# Patient Record
Sex: Male | Born: 1990 | Race: Black or African American | Hispanic: No | Smoking: Current every day smoker
Health system: Southern US, Community
[De-identification: ages and names within clinical notes are randomized; demographics above are authoritative.]

## PROBLEM LIST (undated history)

## (undated) DIAGNOSIS — Q780 Osteogenesis imperfecta: Secondary | ICD-10-CM

## (undated) DIAGNOSIS — F4310 Post-traumatic stress disorder, unspecified: Secondary | ICD-10-CM

## (undated) HISTORY — PX: STOMACH SURGERY: SHX791

## (undated) HISTORY — PX: HAND ARTHROPLASTY: SHX968

## (undated) HISTORY — PX: HIP ARTHROSCOPY: SUR88

## (undated) HISTORY — PX: MANDIBLE RECONSTRUCTION: SHX431

## (undated) HISTORY — PX: ELBOW ARTHROPLASTY: SHX928

---
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
## 2012-05-20 IMAGING — CR DG FOREARM 2V*L*
1 series · 3 of 3 positions shown · non-contrast
Comparison: none

REASON FOR EXAM: open deformity sp fall
COMMENTS:

[Series 1: view not recorded · 0.17mm/px · 3 of 3 slices shown]
[im 1/3]
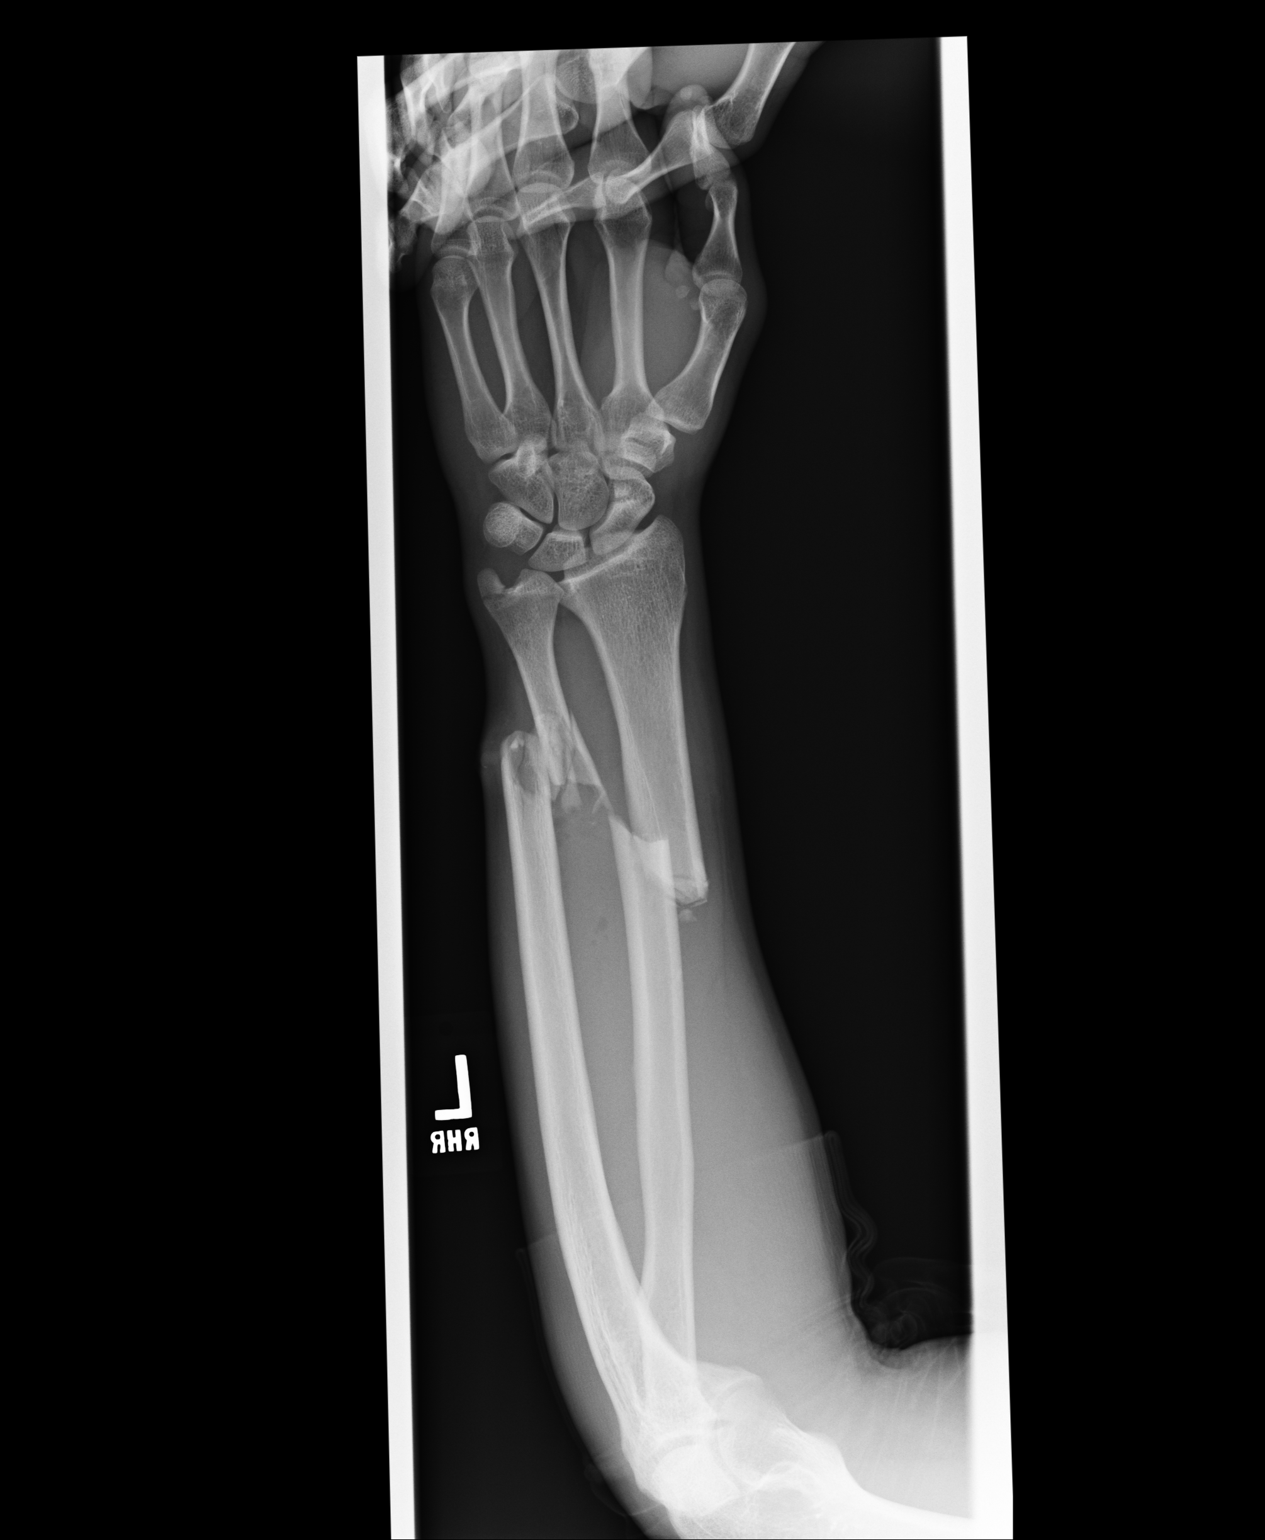
[im 2/3]
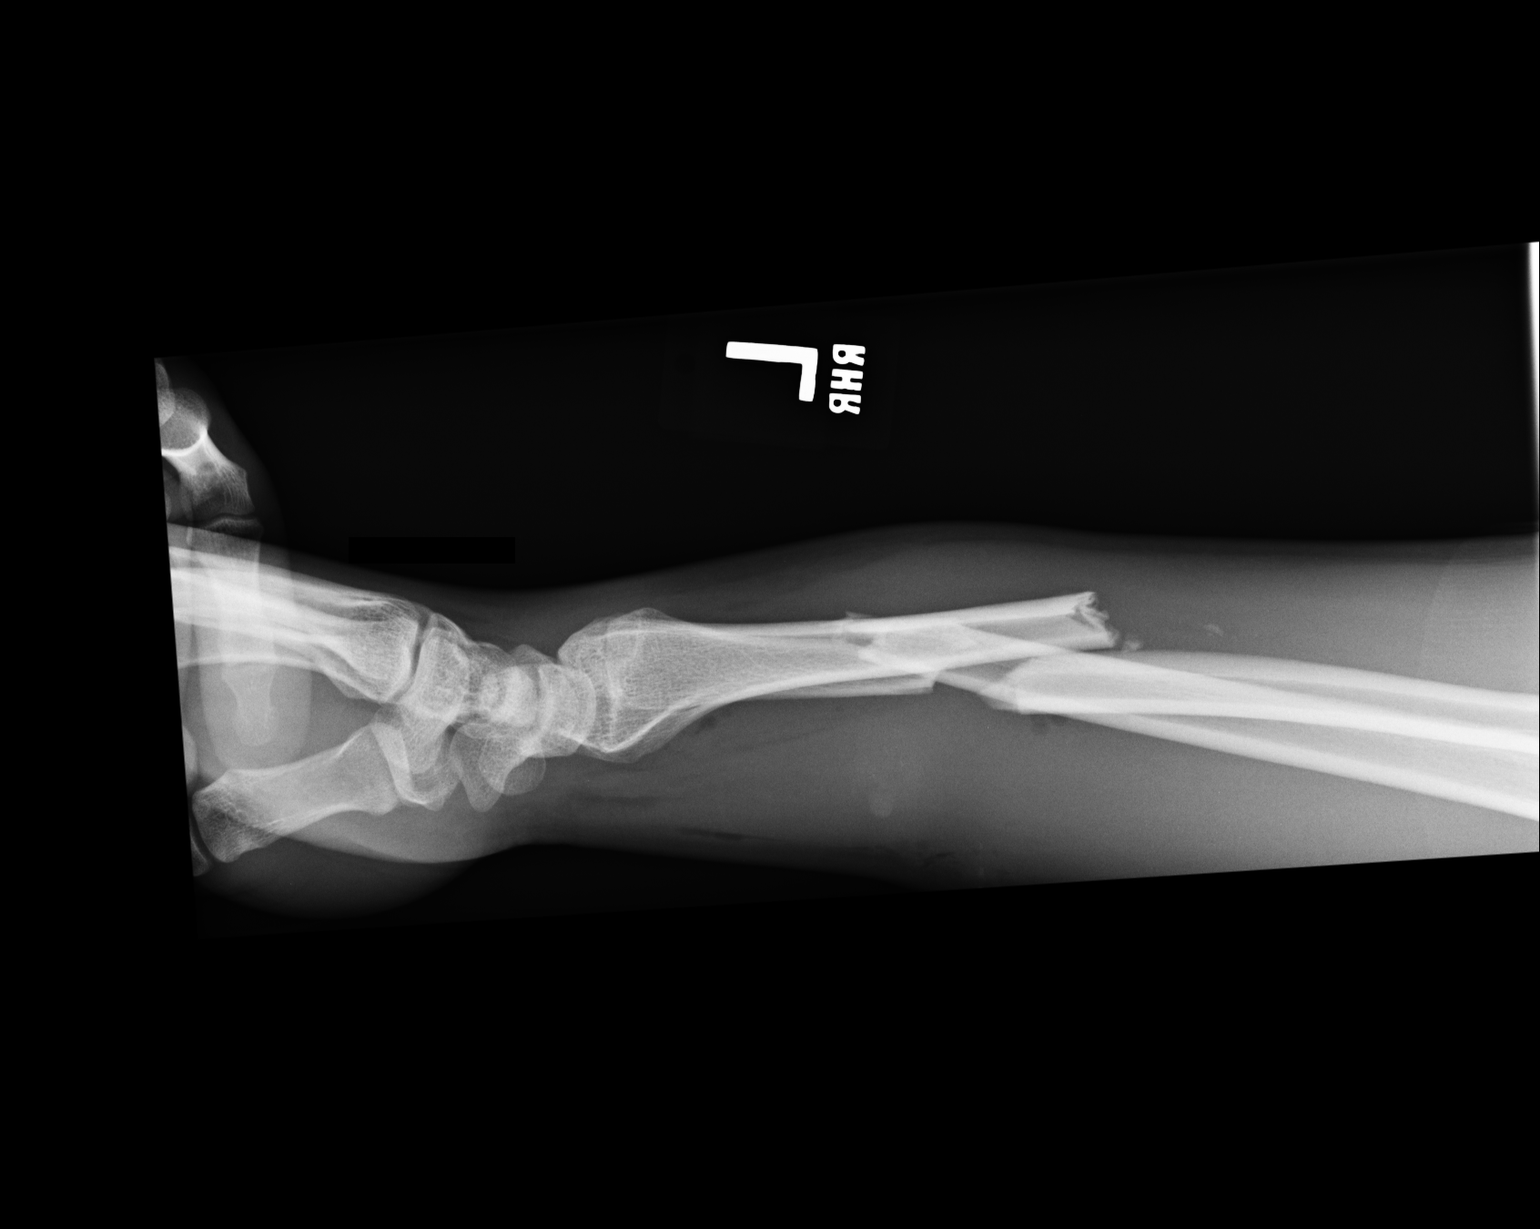
[im 3/3]
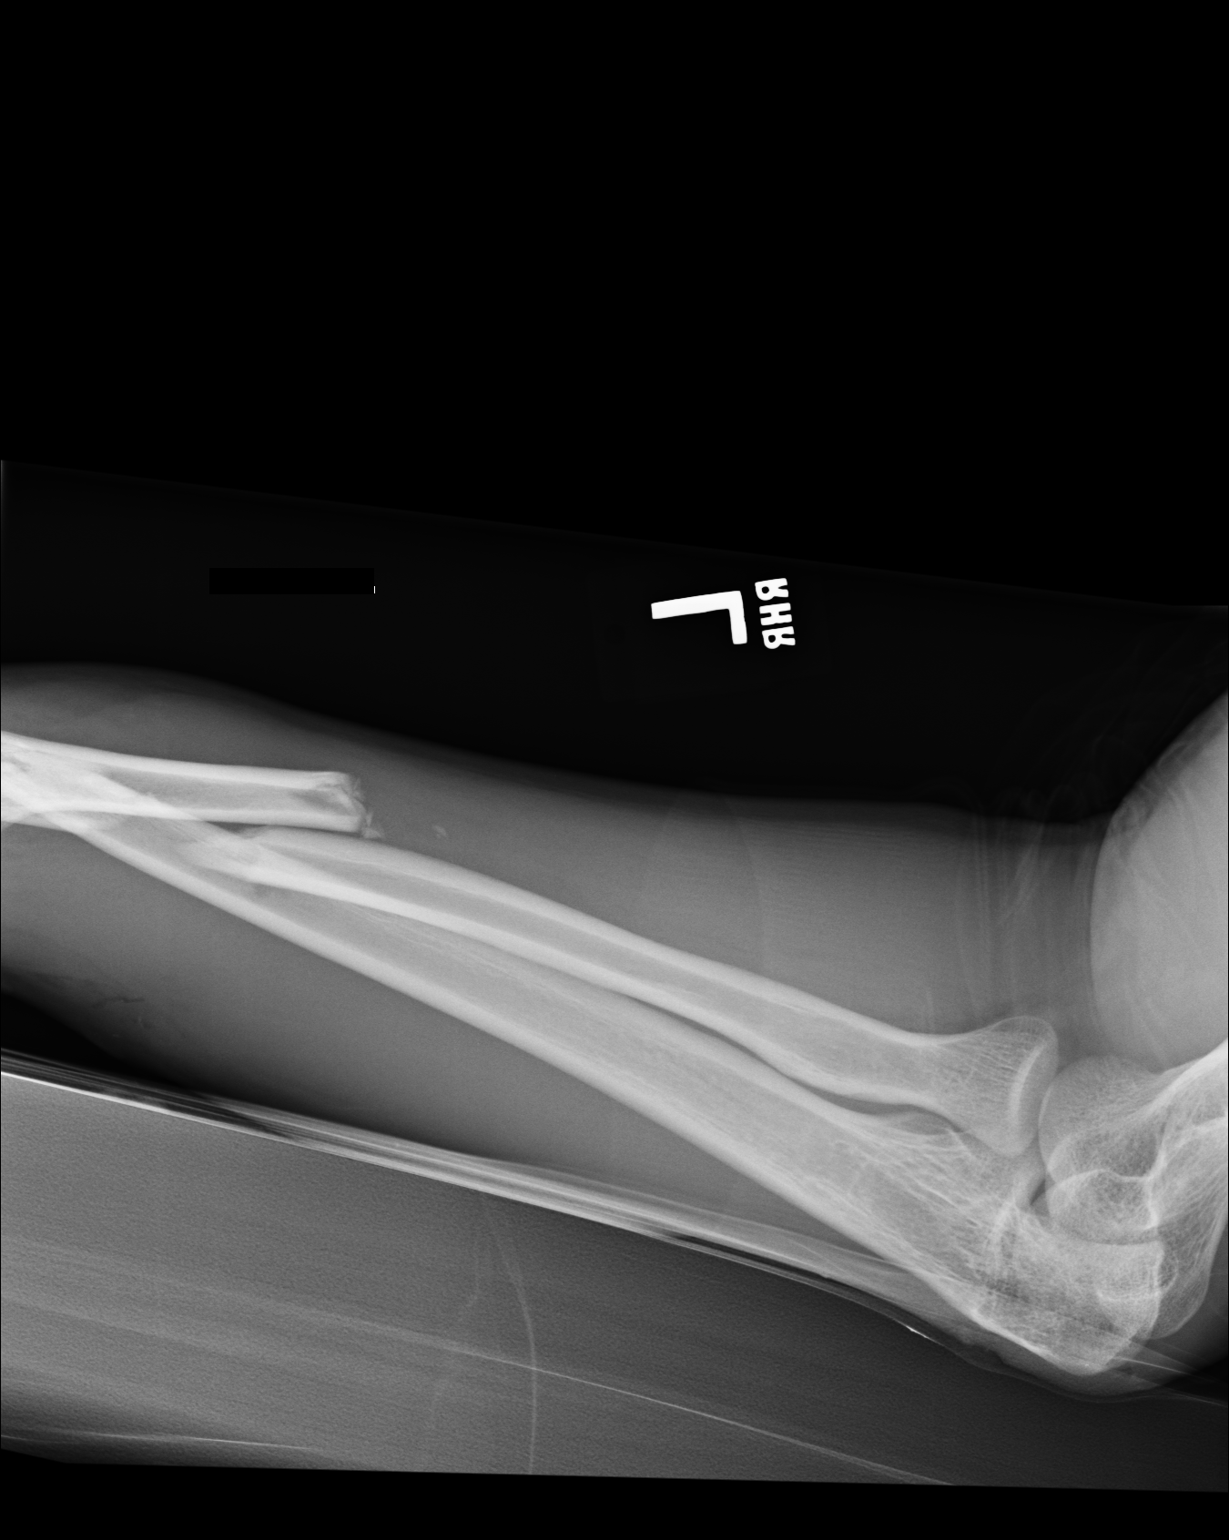

[3 of 3 positions shown; findings below may reference images not displayed]

PROCEDURE:     DXR - DXR FOREARM LEFT  - August 05, 2010  [DATE]

RESULT:     There is a mildly comminuted fracture of the radial diaphysis
near the junction of the proximal two thirds and distal third. The distal
fracture component is displaced dorsally and laterally. The distal fracture
component overrides the proximal dorsally by approximately 1.2 cm.

There is a comminuted fracture of the distal ulna diaphysis with the major
distal fracture component displaced laterally by approximately 1.2 cm. The
proximal fracture fragment overrides the distal by approximately 1.2 cm.
IMPRESSION: 1. There are comminuted displaced fractures of the left radial and ulnar
diaphyses as noted above.
2. Also observed is a nondisplaced fracture of the ulna styloid.

## 2013-03-25 ENCOUNTER — Emergency Department: Payer: Self-pay | Admitting: Emergency Medicine

## 2013-06-25 ENCOUNTER — Emergency Department: Payer: Self-pay | Admitting: Emergency Medicine

## 2013-06-25 LAB — HEPATIC FUNCTION PANEL A (ARMC)
Albumin: 4.5 g/dL (ref 3.4–5.0)
Alkaline Phosphatase: 149 U/L — ABNORMAL HIGH (ref 50–136)
Bilirubin, Direct: <0.1 mg/dL (ref 0.00–0.20)
Bilirubin,Total: 0.2 mg/dL (ref 0.2–1.0)
SGOT(AST): 37 U/L (ref 15–37)
SGPT (ALT): 35 U/L (ref 12–78)
Total Protein: 8.8 g/dL — ABNORMAL HIGH (ref 6.4–8.2)

## 2013-06-25 LAB — CBC
HCT: 49.6 % (ref 40.0–52.0)
HGB: 17.1 g/dL (ref 13.0–18.0)
MCH: 30.1 pg (ref 26.0–34.0)
MCHC: 34.4 g/dL (ref 32.0–36.0)
MCV: 87 fL (ref 80–100)
Platelet: 288 10*3/uL (ref 150–440)
RBC: 5.67 10*6/uL (ref 4.40–5.90)
RDW: 13.4 % (ref 11.5–14.5)
WBC: 9.8 10*3/uL (ref 3.8–10.6)

## 2013-06-25 LAB — SEDIMENTATION RATE: Erythrocyte Sed Rate: 1 mm/hr (ref 0–15)

## 2013-06-25 LAB — BASIC METABOLIC PANEL
Anion Gap: 4 — ABNORMAL LOW (ref 7–16)
BUN: 9 mg/dL (ref 7–18)
Calcium, Total: 9.4 mg/dL (ref 8.5–10.1)
Chloride: 105 mmol/L (ref 98–107)
Co2: 27 mmol/L (ref 21–32)
Creatinine: 0.97 mg/dL (ref 0.60–1.30)
EGFR (African American): >60
EGFR (Non-African Amer.): >60
Glucose: 83 mg/dL (ref 65–99)
Osmolality: 270 (ref 275–301)
Potassium: 4 mmol/L (ref 3.5–5.1)
Sodium: 136 mmol/L (ref 136–145)

## 2013-07-09 ENCOUNTER — Ambulatory Visit: Payer: Self-pay | Admitting: Unknown Physician Specialty

## 2013-07-13 LAB — PATHOLOGY REPORT

## 2014-10-29 ENCOUNTER — Emergency Department: Payer: Self-pay | Admitting: Emergency Medicine

## 2015-08-12 ENCOUNTER — Encounter: Payer: Self-pay | Admitting: Emergency Medicine

## 2015-08-12 ENCOUNTER — Emergency Department
Admission: EM | Admit: 2015-08-12 | Discharge: 2015-08-12 | Disposition: A | Discharge status: Home / Self Care | Payer: Self-pay | Attending: Emergency Medicine | Admitting: Emergency Medicine

## 2015-08-12 DIAGNOSIS — F1210 Cannabis abuse, uncomplicated: Secondary | ICD-10-CM | POA: Insufficient documentation

## 2015-08-12 DIAGNOSIS — F17200 Nicotine dependence, unspecified, uncomplicated: Secondary | ICD-10-CM | POA: Insufficient documentation

## 2015-08-12 DIAGNOSIS — F99 Mental disorder, not otherwise specified: Secondary | ICD-10-CM | POA: Insufficient documentation

## 2015-08-12 DIAGNOSIS — F329 Major depressive disorder, single episode, unspecified: Secondary | ICD-10-CM | POA: Insufficient documentation

## 2015-08-12 DIAGNOSIS — F32A Depression, unspecified: Secondary | ICD-10-CM

## 2015-08-12 HISTORY — DX: Osteogenesis imperfecta: Q78.0

## 2015-08-12 LAB — COMPREHENSIVE METABOLIC PANEL
ALT: 47 U/L (ref 17–63)
AST: 42 U/L — ABNORMAL HIGH (ref 15–41)
Albumin: 5.1 g/dL — ABNORMAL HIGH (ref 3.5–5.0)
Alkaline Phosphatase: 115 U/L (ref 38–126)
Anion gap: 9 (ref 5–15)
BUN: 13 mg/dL (ref 6–20)
CO2: 27 mmol/L (ref 22–32)
Calcium: 9.6 mg/dL (ref 8.9–10.3)
Chloride: 101 mmol/L (ref 101–111)
Creatinine, Ser: 0.97 mg/dL (ref 0.61–1.24)
GFR calc Af Amer: >60 mL/min (ref >60)
GFR calc non Af Amer: >60 mL/min (ref >60)
Glucose, Bld: 74 mg/dL (ref 65–99)
Potassium: 3.4 mmol/L — ABNORMAL LOW (ref 3.5–5.1)
Sodium: 137 mmol/L (ref 135–145)
Total Bilirubin: 1 mg/dL (ref 0.3–1.2)
Total Protein: 9.1 g/dL — ABNORMAL HIGH (ref 6.5–8.1)

## 2015-08-12 LAB — URINE DRUG SCREEN, QUALITATIVE (ARMC ONLY)
Amphetamines, Ur Screen: NOT DETECTED
Barbiturates, Ur Screen: NOT DETECTED
Benzodiazepine, Ur Scrn: NOT DETECTED
Cannabinoid 50 Ng, Ur ~~LOC~~: POSITIVE — AB
Cocaine Metabolite,Ur ~~LOC~~: NOT DETECTED
MDMA (Ecstasy)Ur Screen: NOT DETECTED
Methadone Scn, Ur: NOT DETECTED
Opiate, Ur Screen: NOT DETECTED
Phencyclidine (PCP) Ur S: NOT DETECTED
Tricyclic, Ur Screen: NOT DETECTED

## 2015-08-12 LAB — CBC
HCT: 49.2 % (ref 40.0–52.0)
Hemoglobin: 16.8 g/dL (ref 13.0–18.0)
MCH: 29.8 pg (ref 26.0–34.0)
MCHC: 34.2 g/dL (ref 32.0–36.0)
MCV: 87 fL (ref 80.0–100.0)
Platelets: 309 10*3/uL (ref 150–440)
RBC: 5.65 MIL/uL (ref 4.40–5.90)
RDW: 13.4 % (ref 11.5–14.5)
WBC: 13 10*3/uL — ABNORMAL HIGH (ref 3.8–10.6)

## 2015-08-12 LAB — SALICYLATE LEVEL: Salicylate Lvl: <4 mg/dL (ref 2.8–30.0)

## 2015-08-12 LAB — ACETAMINOPHEN LEVEL: Acetaminophen (Tylenol), Serum: <10 ug/mL — ABNORMAL LOW (ref 10–30)

## 2015-08-12 LAB — ETHANOL: Alcohol, Ethyl (B): 41 mg/dL — ABNORMAL HIGH (ref <5)

## 2015-08-12 NOTE — ED Notes (Signed)
Pt dressed out into behavioral clothes, 2 bags of belongings (clothes and car key) searched and given to April, report of pt to same, Sgt McAdoo and Alissa apprised of pt situation

## 2015-08-12 NOTE — ED Notes (Signed)
[address] Sum[address] GreenbrierHaith, [phone], and "grandma", [phone], contacted this RN for pt info, oriented to phone and visitation rules
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
## 2015-11-02 ENCOUNTER — Encounter: Payer: Self-pay | Admitting: Emergency Medicine

## 2015-11-02 ENCOUNTER — Emergency Department
Admission: EM | Admit: 2015-11-02 | Discharge: 2015-11-02 | Disposition: A | Discharge status: Home / Self Care | Payer: Self-pay | Attending: Emergency Medicine | Admitting: Emergency Medicine

## 2015-11-02 DIAGNOSIS — Y990 Civilian activity done for income or pay: Secondary | ICD-10-CM | POA: Insufficient documentation

## 2015-11-02 DIAGNOSIS — Y9289 Other specified places as the place of occurrence of the external cause: Secondary | ICD-10-CM | POA: Insufficient documentation

## 2015-11-02 DIAGNOSIS — S61219A Laceration without foreign body of unspecified finger without damage to nail, initial encounter: Secondary | ICD-10-CM

## 2015-11-02 DIAGNOSIS — S61211A Laceration without foreign body of left index finger without damage to nail, initial encounter: Secondary | ICD-10-CM | POA: Insufficient documentation

## 2015-11-02 DIAGNOSIS — Y288XXA Contact with other sharp object, undetermined intent, initial encounter: Secondary | ICD-10-CM | POA: Insufficient documentation

## 2015-11-02 DIAGNOSIS — F17200 Nicotine dependence, unspecified, uncomplicated: Secondary | ICD-10-CM | POA: Insufficient documentation

## 2015-11-02 DIAGNOSIS — Z23 Encounter for immunization: Secondary | ICD-10-CM | POA: Insufficient documentation

## 2015-11-02 DIAGNOSIS — Y9389 Activity, other specified: Secondary | ICD-10-CM | POA: Insufficient documentation

## 2015-11-02 MED ORDER — TRAMADOL HCL 50 MG PO TABS: 50.0000 mg | ORAL_TABLET | Freq: Four times a day (QID) | ORAL | Status: DC | PRN

## 2015-11-02 MED ORDER — TETANUS-DIPHTH-ACELL PERTUSSIS 5-2.5-18.5 LF-MCG/0.5 IM SUSP
0.5000 mL | Freq: Once | INTRAMUSCULAR | Status: AC
Start: 2015-11-02 — End: 2015-11-02
  Administered 2015-11-02: 0.5 mL via INTRAMUSCULAR
  Filled 2015-11-02: qty 0.5

## 2015-11-02 NOTE — ED Notes (Signed)
Laceration noted to left hand   Avulsion noted to left index finger..cutting some plastic wrap at work

## 2015-11-02 NOTE — Discharge Instructions (Signed)

## 2015-11-02 NOTE — ED Provider Notes (Signed)
[facility] Emergency Department Provider Note  ____________________________________________  Time seen: Approximately 4:51 PM  I have reviewed the triage vital signs and the nursing notes.   HISTORY  Chief Complaint Laceration    HPI Michael Finley is a 25 y.o. male who presents to emergency department complaining of a laceration to his left index finger. Patient states he was using a box cutter at work when it slipped and cut through his finger. Patient states that he wrapped his finger up with direct pressure prior to arrival. Patient denies using blood thinners. He denies any bleeding disorders. She states that the pain is moderate this time.   Past Medical History  Diagnosis Date  . Osteogenesis imperfecta     There are no active problems to display for this patient.   Past Surgical History  Procedure Laterality Date  . Elbow arthroplasty Bilateral     Current Outpatient Rx  Name  Route  Sig  Dispense  Refill  . traMADol (ULTRAM) 50 MG tablet   Oral   Take 1 tablet (50 mg total) by mouth every 6 (six) hours as needed.   5 tablet   0     Allergies Review of patient's allergies indicates no known allergies.  No family history on file.  Social History Social History  Substance Use Topics  . Smoking status: Current Every Day Smoker -- 0.50 packs/day  . Smokeless tobacco: None  . Alcohol Use: Yes     Comment: occasional     Review of Systems  Constitutional: No fever/chills Cardiovascular: no chest pain. Respiratory: no cough. No SOB. Skin: Negative for rash. Positive for laceration to the second digit left hand Neurological: Negative for headaches, focal weakness or numbness. 10-point ROS otherwise negative.  ____________________________________________   PHYSICAL EXAM:  VITAL SIGNS: ED Triage Vitals  Enc Vitals Group     BP 11/02/15 1642 163/105 mmHg     Pulse Rate 11/02/15 1642 111     Resp 11/02/15 1642 18   Temp 11/02/15 1642 98.6 F (37 C)     Temp Source 11/02/15 1642 Oral     SpO2 11/02/15 1642 99 %     Weight 11/02/15 1642 230 lb (104.327 kg)     Height 11/02/15 1642  (1.753 m)     Head Cir --      Peak Flow --      Pain Score 11/02/15 1642 3     Pain Loc --      Pain Edu? --      Excl. in GC? --      Constitutional: Alert and oriented. Well appearing and in no acute distress. Eyes: Conjunctivae are normal. PERRL. EOMI. Head: Atraumatic. Cardiovascular: Normal rate, regular rhythm. Normal S1 and S2.  Good peripheral circulation. Respiratory: Normal respiratory effort without tachypnea or retractions. Lungs CTAB. Musculoskeletal: Full range of motion to all digits left hand. Laceration as described below. Neurologic:  Normal speech and language. No gross focal neurologic deficits are appreciated.  Skin:  Skin is warm, dry and intact. No rash noted. Patient has a avulsion laceration to the lateral aspect of the second digit left hand. There is no skin flap remaining. Bleeding was controlled prior to arrival. Upon removing the bandage area starts to bleed again. This is controlled using direct pressure. No visible foreign body. Patient's capillary refill is present distally. Sensation intact distally. Psychiatric: Mood and affect are normal. Speech and behavior are normal. Patient exhibits appropriate insight and judgement.  ____________________________________________   LABS (all labs ordered are listed, but only abnormal results are displayed)  Labs Reviewed - No data to display ____________________________________________  EKG   ____________________________________________  RADIOLOGY   No results found.  ____________________________________________    PROCEDURES  Procedure(s) performed:    LACERATION REPAIR Performed by: Racheal PatchesJonathan D Tadao Emig Authorized by: Delorise RoyalsJonathan D Lionardo Haze Consent: Verbal consent obtained. Risks and benefits: risks, benefits and  alternatives were discussed Consent given by: patient Patient identity confirmed: provided demographic data Prepped and Draped in normal sterile fashion Wound explored  Laceration Location: Second digit left hand.  Laceration Length: 2 cm  No Foreign Bodies seen or palpated  Irrigation method: syringe Amount of cleaning: standard  Technique: Laceration is avulsion with no skin flap. There is no area to be closed. Wound is covered and Surgicel, adhesive dressing, and managed accordingly. Patient tolerated procedure well with no complications. Bleeding is controlled with Surgicel.   Patient tolerance: Patient tolerated the procedure well with no immediate complications.    Medications - No data to display   ____________________________________________   INITIAL IMPRESSION / ASSESSMENT AND PLAN / ED COURSE  Pertinent labs & imaging results that were available during my care of the patient were reviewed by me and considered in my medical decision making (see chart for details).  Patient's diagnosis is consistent with a laceration to the second digit left hand. Area is treated as described above.  Patient is given ED precautions to return to the ED for any worsening or new symptoms.     ____________________________________________  FINAL CLINICAL IMPRESSION(S) / ED DIAGNOSES  Final diagnoses:  Finger laceration, initial encounter      NEW MEDICATIONS STARTED DURING THIS VISIT:  New Prescriptions   TRAMADOL (ULTRAM) 50 MG TABLET    Take 1 tablet (50 mg total) by mouth every 6 (six) hours as needed.        This chart was dictated using voice recognition software/Dragon. Despite best efforts to proofread, errors can occur which can change the meaning. Any change was purely unintentional.    Racheal PatchesJonathan D Kailyn Vanderslice, PA-C 11/02/15 1708  Arnaldo NatalPaul F Malinda, MD 11/03/15 [phone]

## 2015-12-04 ENCOUNTER — Encounter: Payer: Self-pay | Admitting: Emergency Medicine

## 2015-12-04 ENCOUNTER — Emergency Department
Admission: EM | Admit: 2015-12-04 | Discharge: 2015-12-04 | Disposition: A | Discharge status: Home / Self Care | Payer: Self-pay | Attending: Emergency Medicine | Admitting: Emergency Medicine

## 2015-12-04 DIAGNOSIS — Y998 Other external cause status: Secondary | ICD-10-CM | POA: Insufficient documentation

## 2015-12-04 DIAGNOSIS — M5442 Lumbago with sciatica, left side: Secondary | ICD-10-CM | POA: Insufficient documentation

## 2015-12-04 DIAGNOSIS — F17200 Nicotine dependence, unspecified, uncomplicated: Secondary | ICD-10-CM | POA: Insufficient documentation

## 2015-12-04 DIAGNOSIS — M5432 Sciatica, left side: Secondary | ICD-10-CM

## 2015-12-04 DIAGNOSIS — Y9389 Activity, other specified: Secondary | ICD-10-CM | POA: Insufficient documentation

## 2015-12-04 DIAGNOSIS — S39012A Strain of muscle, fascia and tendon of lower back, initial encounter: Secondary | ICD-10-CM | POA: Insufficient documentation

## 2015-12-04 DIAGNOSIS — Y92007 Garden or yard of unspecified non-institutional (private) residence as the place of occurrence of the external cause: Secondary | ICD-10-CM | POA: Insufficient documentation

## 2015-12-04 DIAGNOSIS — X500XXA Overexertion from strenuous movement or load, initial encounter: Secondary | ICD-10-CM | POA: Insufficient documentation

## 2015-12-04 MED ORDER — OXYCODONE-ACETAMINOPHEN 5-325 MG PO TABS: 1.0000 | ORAL_TABLET | ORAL | Status: DC | PRN

## 2015-12-04 MED ORDER — OXYCODONE-ACETAMINOPHEN 5-325 MG PO TABS
1.0000 | ORAL_TABLET | Freq: Once | ORAL | Status: AC
  Administered 2015-12-04: 1 via ORAL
  Filled 2015-12-04: qty 1

## 2015-12-04 MED ORDER — KETOROLAC TROMETHAMINE 30 MG/ML IJ SOLN
30.0000 mg | Freq: Once | INTRAMUSCULAR | Status: AC
  Administered 2015-12-04: 30 mg via INTRAMUSCULAR
  Filled 2015-12-04: qty 1

## 2015-12-04 MED ORDER — CYCLOBENZAPRINE HCL 10 MG PO TABS
5.0000 mg | ORAL_TABLET | Freq: Once | ORAL | Status: AC
  Administered 2015-12-04: 5 mg via ORAL
  Filled 2015-12-04: qty 2

## 2015-12-04 MED ORDER — CYCLOBENZAPRINE HCL 5 MG PO TABS: 5.0000 mg | ORAL_TABLET | Freq: Three times a day (TID) | ORAL | Status: DC | PRN

## 2015-12-04 MED ORDER — IBUPROFEN 800 MG PO TABS: 800.0000 mg | ORAL_TABLET | Freq: Three times a day (TID) | ORAL | Status: DC | PRN

## 2015-12-04 NOTE — ED Notes (Signed)
Pt. States while working in his yard, picked up a large tree branch and heard something "pop" in his back.  Pt. States unable to sleep tonight.  Pt. States pain is in the lower left side of back.

## 2015-12-04 NOTE — Discharge Instructions (Signed)
1. Knee may take medicines as needed for pain and muscle spasms (Motrin/Percocet/Flexeril #15). 2. Apply moist heat to affected area several times daily. 3. Return to the ER for worsening symptoms, persistent vomiting, difficulty breathing or other concerns.  Low Back Sprain With Rehab A sprain is an injury in which a ligament is torn. The ligaments of the lower back are vulnerable to sprains. However, they are strong and require great force to be injured. These ligaments are important for stabilizing the spinal column. Sprains are classified into three categories. Grade 1 sprains cause pain, but the tendon is not lengthened. Grade 2 sprains include a lengthened ligament, due to the ligament being stretched or partially ruptured. With grade 2 sprains there is still function, although the function may be decreased. Grade 3 sprains involve a complete tear of the tendon or muscle, and function is usually impaired. SYMPTOMS   Severe pain in the lower back.  Sometimes, a feeling of a "pop," "snap," or tear, at the time of injury.  Tenderness and sometimes swelling at the injury site.  Uncommonly, bruising (contusion) within 48 hours of injury.  Muscle spasms in the back. CAUSES  Low back sprains occur when a force is placed on the ligaments that is greater than they can handle. Common causes of injury include:  Performing a stressful act while off-balance.  Repetitive stressful activities that involve movement of the lower back.  Direct hit (trauma) to the lower back. RISK INCREASES WITH:  Contact sports (football, wrestling).  Collisions (major skiing accidents).  Sports that require throwing or lifting (baseball, weightlifting).  Sports involving twisting of the spine (gymnastics, diving, tennis, golf).  Poor strength and flexibility.  Inadequate protection.  Previous back injury or surgery (especially fusion). PREVENTION  Wear properly fitted and padded protective  equipment.  Warm up and stretch properly before activity.  Allow for adequate recovery between workouts.  Maintain physical fitness:  Strength, flexibility, and endurance.  Cardiovascular fitness.  Maintain a healthy body weight. PROGNOSIS  If treated properly, low back sprains usually heal with non-surgical treatment. The length of time for healing depends on the severity of the injury.  RELATED COMPLICATIONS   Recurring symptoms, resulting in a chronic problem.  Chronic inflammation and pain in the low back.  Delayed healing or resolution of symptoms, especially if activity is resumed too soon.  Prolonged impairment.  Unstable or arthritic joints of the low back. TREATMENT  Treatment first involves the use of ice and medicine, to reduce pain and inflammation. The use of strengthening and stretching exercises may help reduce pain with activity. These exercises may be performed at home or with a therapist. Severe injuries may require referral to a therapist for further evaluation and treatment, such as ultrasound. Your caregiver may advise that you wear a back brace or corset, to help reduce pain and discomfort. Often, prolonged bed rest results in greater harm then benefit. Corticosteroid injections may be recommended. However, these should be reserved for the most serious cases. It is important to avoid using your back when lifting objects. At night, sleep on your back on a firm mattress, with a pillow placed under your knees. If non-surgical treatment is unsuccessful, surgery may be needed.  MEDICATION   If pain medicine is needed, nonsteroidal anti-inflammatory medicines (aspirin and ibuprofen), or other minor pain relievers (acetaminophen), are often advised.  Do not take pain medicine for 7 days before surgery.  Prescription pain relievers may be given, if your caregiver thinks they are needed.  Use only as directed and only as much as you need.  Ointments applied to the skin  may be helpful.  Corticosteroid injections may be given by your caregiver. These injections should be reserved for the most serious cases, because they may only be given a certain number of times. HEAT AND COLD  Cold treatment (icing) should be applied for 10 to 15 minutes every 2 to 3 hours for inflammation and pain, and immediately after activity that aggravates your symptoms. Use ice packs or an ice massage.  Heat treatment may be used before performing stretching and strengthening activities prescribed by your caregiver, physical therapist, or athletic trainer. Use a heat pack or a warm water soak. SEEK MEDICAL CARE IF:   Symptoms get worse or do not improve in 2 to 4 weeks, despite treatment.  You develop numbness or weakness in either leg.  You lose bowel or bladder function.  Any of the following occur after surgery: fever, increased pain, swelling, redness, drainage of fluids, or bleeding in the affected area.  New, unexplained symptoms develop. (Drugs used in treatment may produce side effects.) EXERCISES  RANGE OF MOTION (ROM) AND STRETCHING EXERCISES - Low Back Sprain Most people with lower back pain will find that their symptoms get worse with excessive bending forward (flexion) or arching at the lower back (extension). The exercises that will help resolve your symptoms will focus on the opposite motion.  Your physician, physical therapist or athletic trainer will help you determine which exercises will be most helpful to resolve your lower back pain. Do not complete any exercises without first consulting with your caregiver. Discontinue any exercises which make your symptoms worse, until you speak to your caregiver. If you have pain, numbness or tingling which travels down into your buttocks, leg or foot, the goal of the therapy is for these symptoms to move closer to your back and eventually resolve. Sometimes, these leg symptoms will get better, but your lower back pain may  worsen. This is often an indication of progress in your rehabilitation. Be very alert to any changes in your symptoms and the activities in which you participated in the 24 hours prior to the change. Sharing this information with your caregiver will allow him or her to most efficiently treat your condition. These exercises may help you when beginning to rehabilitate your injury. Your symptoms may resolve with or without further involvement from your physician, physical therapist or athletic trainer. While completing these exercises, remember:   Restoring tissue flexibility helps normal motion to return to the joints. This allows healthier, less painful movement and activity.  An effective stretch should be held for at least 30 seconds.  A stretch should never be painful. You should only feel a gentle lengthening or release in the stretched tissue. FLEXION RANGE OF MOTION AND STRETCHING EXERCISES: STRETCH - Flexion, Single Knee to Chest   Lie on a firm bed or floor with both legs extended in front of you.  Keeping one leg in contact with the floor, bring your opposite knee to your chest. Hold your leg in place by either grabbing behind your thigh or at your knee.  Pull until you feel a gentle stretch in your low back. Hold __________ seconds.  Slowly release your grasp and repeat the exercise with the opposite side. Repeat __________ times. Complete this exercise __________ times per day.  STRETCH - Flexion, Double Knee to Chest  Lie on a firm bed or floor with both legs extended in front  of you.  Keeping one leg in contact with the floor, bring your opposite knee to your chest.  Tense your stomach muscles to support your back and then lift your other knee to your chest. Hold your legs in place by either grabbing behind your thighs or at your knees.  Pull both knees toward your chest until you feel a gentle stretch in your low back. Hold __________ seconds.  Tense your stomach muscles and  slowly return one leg at a time to the floor. Repeat __________ times. Complete this exercise __________ times per day.  STRETCH - Low Trunk Rotation  Lie on a firm bed or floor. Keeping your legs in front of you, bend your knees so they are both pointed toward the ceiling and your feet are flat on the floor.  Extend your arms out to the side. This will stabilize your upper body by keeping your shoulders in contact with the floor.  Gently and slowly drop both knees together to one side until you feel a gentle stretch in your low back. Hold for __________ seconds.  Tense your stomach muscles to support your lower back as you bring your knees back to the starting position. Repeat the exercise to the other side. Repeat __________ times. Complete this exercise __________ times per day  EXTENSION RANGE OF MOTION AND FLEXIBILITY EXERCISES: STRETCH - Extension, Prone on Elbows   Lie on your stomach on the floor, a bed will be too soft. Place your palms about shoulder width apart and at the height of your head.  Place your elbows under your shoulders. If this is too painful, stack pillows under your chest.  Allow your body to relax so that your hips drop lower and make contact more completely with the floor.  Hold this position for __________ seconds.  Slowly return to lying flat on the floor. Repeat __________ times. Complete this exercise __________ times per day.  RANGE OF MOTION - Extension, Prone Press Ups  Lie on your stomach on the floor, a bed will be too soft. Place your palms about shoulder width apart and at the height of your head.  Keeping your back as relaxed as possible, slowly straighten your elbows while keeping your hips on the floor. You may adjust the placement of your hands to maximize your comfort. As you gain motion, your hands will come more underneath your shoulders.  Hold this position __________ seconds.  Slowly return to lying flat on the floor. Repeat __________  times. Complete this exercise __________ times per day.  RANGE OF MOTION- Quadruped, Neutral Spine   Assume a hands and knees position on a firm surface. Keep your hands under your shoulders and your knees under your hips. You may place padding under your knees for comfort.  Drop your head and point your tailbone toward the ground below you. This will round out your lower back like an angry cat. Hold this position for __________ seconds.  Slowly lift your head and release your tail bone so that your back sags into a large arch, like an old horse.  Hold this position for __________ seconds.  Repeat this until you feel limber in your low back.  Now, find your "sweet spot." This will be the most comfortable position somewhere between the two previous positions. This is your neutral spine. Once you have found this position, tense your stomach muscles to support your low back.  Hold this position for __________ seconds. Repeat __________ times. Complete this exercise __________ times  per day.  STRENGTHENING EXERCISES - Low Back Sprain These exercises may help you when beginning to rehabilitate your injury. These exercises should be done near your "sweet spot." This is the neutral, low-back arch, somewhere between fully rounded and fully arched, that is your least painful position. When performed in this safe range of motion, these exercises can be used for people who have either a flexion or extension based injury. These exercises may resolve your symptoms with or without further involvement from your physician, physical therapist or athletic trainer. While completing these exercises, remember:   Muscles can gain both the endurance and the strength needed for everyday activities through controlled exercises.  Complete these exercises as instructed by your physician, physical therapist or athletic trainer. Increase the resistance and repetitions only as guided.  You may experience muscle soreness  or fatigue, but the pain or discomfort you are trying to eliminate should never worsen during these exercises. If this pain does worsen, stop and make certain you are following the directions exactly. If the pain is still present after adjustments, discontinue the exercise until you can discuss the trouble with your caregiver. STRENGTHENING - Deep Abdominals, Pelvic Tilt   Lie on a firm bed or floor. Keeping your legs in front of you, bend your knees so they are both pointed toward the ceiling and your feet are flat on the floor.  Tense your lower abdominal muscles to press your low back into the floor. This motion will rotate your pelvis so that your tail bone is scooping upwards rather than pointing at your feet or into the floor. With a gentle tension and even breathing, hold this position for __________ seconds. Repeat __________ times. Complete this exercise __________ times per day.  STRENGTHENING - Abdominals, Crunches   Lie on a firm bed or floor. Keeping your legs in front of you, bend your knees so they are both pointed toward the ceiling and your feet are flat on the floor. Cross your arms over your chest.  Slightly tip your chin down without bending your neck.  Tense your abdominals and slowly lift your trunk high enough to just clear your shoulder blades. Lifting higher can put excessive stress on the lower back and does not further strengthen your abdominal muscles.  Control your return to the starting position. Repeat __________ times. Complete this exercise __________ times per day.  STRENGTHENING - Quadruped, Opposite UE/LE Lift   Assume a hands and knees position on a firm surface. Keep your hands under your shoulders and your knees under your hips. You may place padding under your knees for comfort.  Find your neutral spine and gently tense your abdominal muscles so that you can maintain this position. Your shoulders and hips should form a rectangle that is parallel with the  floor and is not twisted.  Keeping your trunk steady, lift your right hand no higher than your shoulder and then your left leg no higher than your hip. Make sure you are not holding your breath. Hold this position for __________ seconds.  Continuing to keep your abdominal muscles tense and your back steady, slowly return to your starting position. Repeat with the opposite arm and leg. Repeat __________ times. Complete this exercise __________ times per day.  STRENGTHENING - Abdominals and Quadriceps, Straight Leg Raise   Lie on a firm bed or floor with both legs extended in front of you.  Keeping one leg in contact with the floor, bend the other knee so that your foot  can rest flat on the floor.  Find your neutral spine, and tense your abdominal muscles to maintain your spinal position throughout the exercise.  Slowly lift your straight leg off the floor about 6 inches for a count of 15, making sure to not hold your breath.  Still keeping your neutral spine, slowly lower your leg all the way to the floor. Repeat this exercise with each leg __________ times. Complete this exercise __________ times per day. POSTURE AND BODY MECHANICS CONSIDERATIONS - Low Back Sprain Keeping correct posture when sitting, standing or completing your activities will reduce the stress put on different body tissues, allowing injured tissues a chance to heal and limiting painful experiences. The following are general guidelines for improved posture. Your physician or physical therapist will provide you with any instructions specific to your needs. While reading these guidelines, remember:  The exercises prescribed by your provider will help you have the flexibility and strength to maintain correct postures.  The correct posture provides the best environment for your joints to work. All of your joints have less wear and tear when properly supported by a spine with good posture. This means you will experience a  healthier, less painful body.  Correct posture must be practiced with all of your activities, especially prolonged sitting and standing. Correct posture is as important when doing repetitive low-stress activities (typing) as it is when doing a single heavy-load activity (lifting). RESTING POSITIONS Consider which positions are most painful for you when choosing a resting position. If you have pain with flexion-based activities (sitting, bending, stooping, squatting), choose a position that allows you to rest in a less flexed posture. You would want to avoid curling into a fetal position on your side. If your pain worsens with extension-based activities (prolonged standing, working overhead), avoid resting in an extended position such as sleeping on your stomach. Most people will find more comfort when they rest with their spine in a more neutral position, neither too rounded nor too arched. Lying on a non-sagging bed on your side with a pillow between your knees, or on your back with a pillow under your knees will often provide some relief. Keep in mind, being in any one position for a prolonged period of time, no matter how correct your posture, can still lead to stiffness. PROPER SITTING POSTURE In order to minimize stress and discomfort on your spine, you must sit with correct posture. Sitting with good posture should be effortless for a healthy body. Returning to good posture is a gradual process. Many people can work toward this most comfortably by using various supports until they have the flexibility and strength to maintain this posture on their own. When sitting with proper posture, your ears will fall over your shoulders and your shoulders will fall over your hips. You should use the back of the chair to support your upper back. Your lower back will be in a neutral position, just slightly arched. You may place a small pillow or folded towel at the base of your lower back for  support.  When  working at a desk, create an environment that supports good, upright posture. Without extra support, muscles tire, which leads to excessive strain on joints and other tissues. Keep these recommendations in mind: CHAIR:  A chair should be able to slide under your desk when your back makes contact with the back of the chair. This allows you to work closely.  The chair's height should allow your eyes to be level with  the upper part of your monitor and your hands to be slightly lower than your elbows. BODY POSITION  Your feet should make contact with the floor. If this is not possible, use a foot rest.  Keep your ears over your shoulders. This will reduce stress on your neck and low back. INCORRECT SITTING POSTURES  If you are feeling tired and unable to assume a healthy sitting posture, do not slouch or slump. This puts excessive strain on your back tissues, causing more damage and pain. Healthier options include:  Using more support, like a lumbar pillow.  Switching tasks to something that requires you to be upright or walking.  Talking a brief walk.  Lying down to rest in a neutral-spine position. PROLONGED STANDING WHILE SLIGHTLY LEANING FORWARD  When completing a task that requires you to lean forward while standing in one place for a long time, place either foot up on a stationary 2-4 inch high object to help maintain the best posture. When both feet are on the ground, the lower back tends to lose its slight inward curve. If this curve flattens (or becomes too large), then the back and your other joints will experience too much stress, tire more quickly, and can cause pain. CORRECT STANDING POSTURES Proper standing posture should be assumed with all daily activities, even if they only take a few moments, like when brushing your teeth. As in sitting, your ears should fall over your shoulders and your shoulders should fall over your hips. You should keep a slight tension in your abdominal  muscles to brace your spine. Your tailbone should point down to the ground, not behind your body, resulting in an over-extended swayback posture.  INCORRECT STANDING POSTURES  Common incorrect standing postures include a forward head, locked knees and/or an excessive swayback. WALKING Walk with an upright posture. Your ears, shoulders and hips should all line-up. PROLONGED ACTIVITY IN A FLEXED POSITION When completing a task that requires you to bend forward at your waist or lean over a low surface, try to find a way to stabilize 3 out of 4 of your limbs. You can place a hand or elbow on your thigh or rest a knee on the surface you are reaching across. This will provide you more stability, so that your muscles do not tire as quickly. By keeping your knees relaxed, or slightly bent, you will also reduce stress across your lower back. CORRECT LIFTING TECHNIQUES DO :  Assume a wide stance. This will provide you more stability and the opportunity to get as close as possible to the object which you are lifting.  Tense your abdominals to brace your spine. Bend at the knees and hips. Keeping your back locked in a neutral-spine position, lift using your leg muscles. Lift with your legs, keeping your back straight.  Test the weight of unknown objects before attempting to lift them.  Try to keep your elbows locked down at your sides in order get the best strength from your shoulders when carrying an object.  Always ask for help when lifting heavy or awkward objects. INCORRECT LIFTING TECHNIQUES DO NOT:   Lock your knees when lifting, even if it is a small object.  Bend and twist. Pivot at your feet or move your feet when needing to change directions.  Assume that you can safely pick up even a paperclip without proper posture.   This information is not intended to replace advice given to you by your health care provider. Make sure you  discuss any questions you have with your health care provider.     Document Released: 08/12/2005 Document Revised: 09/02/2014 Document Reviewed: 11/24/2008 [employer] Patient Education [employer]

## 2015-12-04 NOTE — ED Provider Notes (Signed)
[facility] Emergency Department Provider Note  ____________________________________________  Time seen: Approximately 5:05 AM  I have reviewed the triage vital signs and the nursing notes.   HISTORY  Chief Complaint Back Pain    HPI ROMULUS HANRAHAN is a 25 y.o. male who presents to the ED from home with a chief complaint of low back pain. Patient reports working in a jar yesterday, picking up a large tree branch and heard something "pop" in his lower back. Complains of left lower back pain radiating into his hip and down his leg into the knee. Denies associated symptoms of weakness, numbness, tingling, bowel or bladder incontinence. Denies fever, chills, chest pain, shortness breath, abdominal pain, nausea, vomiting, diarrhea, dysuria. Denies recent travel or trauma. Nothing makes his pain better. Movement makes his pain worse.   Past Medical History  Diagnosis Date  . Osteogenesis imperfecta     There are no active problems to display for this patient.   Past Surgical History  Procedure Laterality Date  . Elbow arthroplasty Bilateral     Current Outpatient Rx  Name  Route  Sig  Dispense  Refill  . traMADol (ULTRAM) 50 MG tablet   Oral   Take 1 tablet (50 mg total) by mouth every 6 (six) hours as needed.   5 tablet   0     Allergies Review of patient's allergies indicates no known allergies.  History reviewed. No pertinent family history.  Social History Social History  Substance Use Topics  . Smoking status: Current Every Day Smoker -- 0.50 packs/day  . Smokeless tobacco: None  . Alcohol Use: Yes     Comment: occasional    Review of Systems  Constitutional: No fever/chills. Eyes: No visual changes. ENT: No sore throat. Cardiovascular: Denies chest pain. Respiratory: Denies shortness of breath. Gastrointestinal: No abdominal pain.  No nausea, no vomiting.  No diarrhea.  No constipation. Genitourinary: Negative for  dysuria. Musculoskeletal: Positive for back pain. Skin: Negative for rash. Neurological: Negative for headaches, focal weakness or numbness.  10-point ROS otherwise negative.  ____________________________________________   PHYSICAL EXAM:  VITAL SIGNS: ED Triage Vitals  Enc Vitals Group     BP 12/04/15 0442 140/77 mmHg     Pulse Rate 12/04/15 0442 77     Resp 12/04/15 0442 18     Temp 12/04/15 0442 98 F (36.7 C)     Temp Source 12/04/15 0442 Oral     SpO2 12/04/15 0442 98 %     Weight 12/04/15 0442 230 lb (104.327 kg)     Height 12/04/15 0442  (1.753 m)     Head Cir --      Peak Flow --      Pain Score 12/04/15 0442 6     Pain Loc --      Pain Edu? --      Excl. in GC? --     Constitutional: Alert and oriented. Well appearing and in no acute distress. Eyes: Conjunctivae are normal. PERRL. EOMI. Head: Atraumatic. Nose: No congestion/rhinnorhea. Mouth/Throat: Mucous membranes are moist.  Oropharynx non-erythematous. Neck: No stridor.  No cervical spine tenderness to palpation. Cardiovascular: Normal rate, regular rhythm. Grossly normal heart sounds.  Good peripheral circulation. Respiratory: Normal respiratory effort.  No retractions. Lungs CTAB. Gastrointestinal: Soft and nontender. No distention. No abdominal bruits. No CVA tenderness. Musculoskeletal: No spinal tenderness to palpation. Left lumbar paraspinal muscle spasms. Limited range of motion secondary to pain. No lower extremity tenderness nor edema.  No joint effusions. Neurologic:  Normal speech and language. No gross focal neurologic deficits are appreciated.  Skin:  Skin is warm, dry and intact. No rash noted. Psychiatric: Mood and affect are normal. Speech and behavior are normal.  ____________________________________________   LABS (all labs ordered are listed, but only abnormal results are displayed)  Labs Reviewed - No data to  display ____________________________________________  EKG  None  ____________________________________________  RADIOLOGY  None ____________________________________________   PROCEDURES  Procedure(s) performed: None  Critical Care performed: No  ____________________________________________   INITIAL IMPRESSION / ASSESSMENT AND PLAN / ED COURSE  Pertinent labs & imaging results that were available during my care of the patient were reviewed by me and considered in my medical decision making (see chart for details).  25 year old male who presents with lumbar strain with sciatica. Plan for NSAID, analgesia, muscle relaxer and follow-up with orthopedics. Strict return precautions given. Patient verbalizes understanding and agrees with plan of care. ____________________________________________   FINAL CLINICAL IMPRESSION(S) / ED DIAGNOSES  Final diagnoses:  Lumbosacral strain, initial encounter  Sciatica of left side      Irean HongJade J Kawthar Ennen, MD 12/04/15 [phone]

## 2015-12-04 NOTE — ED Notes (Signed)
Patient with no complaints at this time. Respirations even and unlabored. Skin warm/dry. Discharge instructions reviewed with patient at this time. Patient given opportunity to voice concerns/ask questions. Patient discharged at this time and left Emergency Department with steady gait.   

## 2016-03-29 ENCOUNTER — Emergency Department
Admission: EM | Admit: 2016-03-29 | Discharge: 2016-03-29 | Disposition: A | Discharge status: Home / Self Care | Payer: Self-pay | Attending: Emergency Medicine | Admitting: Emergency Medicine

## 2016-03-29 DIAGNOSIS — Z5321 Procedure and treatment not carried out due to patient leaving prior to being seen by health care provider: Secondary | ICD-10-CM | POA: Insufficient documentation

## 2016-03-29 DIAGNOSIS — F39 Unspecified mood [affective] disorder: Secondary | ICD-10-CM | POA: Insufficient documentation

## 2016-03-29 NOTE — ED Triage Notes (Signed)
Pt states his wife thinks he needs to be commited.. Pt states he goes through mood swings, one minute he will be mad and the next he is not.. Pt denies any current SI/HI.Marland Kitchen

## 2016-07-31 ENCOUNTER — Encounter (HOSPITAL_COMMUNITY): Payer: Self-pay

## 2016-07-31 ENCOUNTER — Emergency Department (HOSPITAL_COMMUNITY)
Admission: EM | Admit: 2016-07-31 | Discharge: 2016-08-01 | Disposition: A | Discharge status: Home / Self Care | Payer: Self-pay | Attending: Emergency Medicine | Admitting: Emergency Medicine

## 2016-07-31 DIAGNOSIS — Z96622 Presence of left artificial elbow joint: Secondary | ICD-10-CM | POA: Insufficient documentation

## 2016-07-31 DIAGNOSIS — T426X2A Poisoning by other antiepileptic and sedative-hypnotic drugs, intentional self-harm, initial encounter: Secondary | ICD-10-CM | POA: Insufficient documentation

## 2016-07-31 DIAGNOSIS — F4325 Adjustment disorder with mixed disturbance of emotions and conduct: Secondary | ICD-10-CM | POA: Diagnosis present

## 2016-07-31 DIAGNOSIS — Z96621 Presence of right artificial elbow joint: Secondary | ICD-10-CM | POA: Insufficient documentation

## 2016-07-31 DIAGNOSIS — F17200 Nicotine dependence, unspecified, uncomplicated: Secondary | ICD-10-CM | POA: Insufficient documentation

## 2016-07-31 DIAGNOSIS — Z96698 Presence of other orthopedic joint implants: Secondary | ICD-10-CM | POA: Insufficient documentation

## 2016-07-31 DIAGNOSIS — Z79899 Other long term (current) drug therapy: Secondary | ICD-10-CM | POA: Insufficient documentation

## 2016-07-31 HISTORY — DX: Post-traumatic stress disorder, unspecified: F43.10

## 2016-07-31 LAB — CBC WITH DIFFERENTIAL/PLATELET
Basophils Absolute: 0 10*3/uL (ref 0.0–0.1)
Basophils Relative: 0 %
Eosinophils Absolute: 0 10*3/uL (ref 0.0–0.7)
Eosinophils Relative: 0 %
HCT: 45.6 % (ref 39.0–52.0)
HEMOGLOBIN: 15.9 g/dL (ref 13.0–17.0)
LYMPHS ABS: 2 10*3/uL (ref 0.7–4.0)
Lymphocytes Relative: 21 %
MCH: 30.3 pg (ref 26.0–34.0)
MCHC: 34.9 g/dL (ref 30.0–36.0)
MCV: 86.9 fL (ref 78.0–100.0)
MONOS PCT: 6 %
Monocytes Absolute: 0.6 10*3/uL (ref 0.1–1.0)
NEUTROS PCT: 73 %
Neutro Abs: 7.1 10*3/uL (ref 1.7–7.7)
Platelets: 308 10*3/uL (ref 150–400)
RBC: 5.25 MIL/uL (ref 4.22–5.81)
RDW: 12.9 % (ref 11.5–15.5)
WBC: 9.8 10*3/uL (ref 4.0–10.5)

## 2016-07-31 LAB — RAPID URINE DRUG SCREEN, HOSP PERFORMED
AMPHETAMINES: NOT DETECTED
BENZODIAZEPINES: NOT DETECTED
Barbiturates: NOT DETECTED
Cocaine: NOT DETECTED
OPIATES: NOT DETECTED
Tetrahydrocannabinol: POSITIVE — AB

## 2016-07-31 LAB — SALICYLATE LEVEL

## 2016-07-31 LAB — VALPROIC ACID LEVEL
Valproic Acid Lvl: 100 ug/mL (ref 50.0–100.0)
Valproic Acid Lvl: 120 ug/mL — ABNORMAL HIGH (ref 50.0–100.0)
Valproic Acid Lvl: 95 ug/mL (ref 50.0–100.0)

## 2016-07-31 LAB — COMPREHENSIVE METABOLIC PANEL
ALK PHOS: 97 U/L (ref 38–126)
ALT: 41 U/L (ref 17–63)
AST: 26 U/L (ref 15–41)
Albumin: 4.4 g/dL (ref 3.5–5.0)
Anion gap: 8 (ref 5–15)
BILIRUBIN TOTAL: 0.7 mg/dL (ref 0.3–1.2)
BUN: 10 mg/dL (ref 6–20)
CALCIUM: 9.2 mg/dL (ref 8.9–10.3)
CO2: 27 mmol/L (ref 22–32)
CREATININE: 1 mg/dL (ref 0.61–1.24)
Chloride: 101 mmol/L (ref 101–111)
GFR calc Af Amer: >60 mL/min (ref >60)
Glucose, Bld: 88 mg/dL (ref 65–99)
POTASSIUM: 4.5 mmol/L (ref 3.5–5.1)
Sodium: 136 mmol/L (ref 135–145)
TOTAL PROTEIN: 8 g/dL (ref 6.5–8.1)

## 2016-07-31 LAB — ETHANOL: Alcohol, Ethyl (B): <5 mg/dL (ref <5)

## 2016-07-31 LAB — ACETAMINOPHEN LEVEL

## 2016-07-31 MED ORDER — SODIUM CHLORIDE 0.9 % IV BOLUS (SEPSIS)
1000.0000 mL | Freq: Once | INTRAVENOUS | Status: AC
  Administered 2016-07-31: 1000 mL via INTRAVENOUS

## 2016-07-31 MED ORDER — OLANZAPINE 10 MG PO TBDP
10.0000 mg | ORAL_TABLET | Freq: Once | ORAL | Status: DC
  Filled 2016-07-31: qty 1

## 2016-07-31 MED ORDER — ACETAMINOPHEN 325 MG PO TABS: 650.0000 mg | ORAL_TABLET | ORAL | Status: DC | PRN

## 2016-07-31 MED ORDER — LORAZEPAM 1 MG PO TABS: 1.0000 mg | ORAL_TABLET | Freq: Three times a day (TID) | ORAL | Status: DC | PRN

## 2016-07-31 MED ORDER — ONDANSETRON HCL 4 MG PO TABS: 4.0000 mg | ORAL_TABLET | Freq: Three times a day (TID) | ORAL | Status: DC | PRN

## 2016-07-31 MED ORDER — LORAZEPAM 2 MG/ML IJ SOLN
2.0000 mg | Freq: Once | INTRAMUSCULAR | Status: DC | PRN
  Filled 2016-07-31: qty 1

## 2016-07-31 MED ORDER — HALOPERIDOL LACTATE 5 MG/ML IJ SOLN
10.0000 mg | Freq: Once | INTRAMUSCULAR | Status: DC | PRN
  Filled 2016-07-31: qty 2

## 2016-07-31 MED ORDER — ALUM & MAG HYDROXIDE-SIMETH 200-200-20 MG/5ML PO SUSP: 30.0000 mL | ORAL | Status: DC | PRN

## 2016-07-31 MED ORDER — SODIUM CHLORIDE 0.9 % IV SOLN: INTRAVENOUS | Status: DC

## 2016-07-31 NOTE — ED Notes (Signed)
Patient aware of urine sample. Urinal placed at bedside and patient encouraged to void when able. Patient states he cannot void at this time.

## 2016-07-31 NOTE — ED Triage Notes (Signed)
He states he took eight 500mg  Depakote tablets, along with four Flexeril tablets at about 11 a.m. Today. He cites being depressed d/t a current child custody battle. Police were on scene, also pt. Threatened to kill his wife. [place] [employer] deputies [place] with him. He is awake and in no distress.

## 2016-07-31 NOTE — Progress Notes (Signed)
ED CM left pt uninsured [place] resources in his locker #12 CM spoke with pt who [place]cp.  CM provided written information to assist pt with determining choice for uninsured accepting pcps, discussed the importance of pcp vs EDP services for f/u care, www.needymeds.org, www.goodrx.com, discounted pharmacies and other [place] resources such as [facility] , [facility], affordable care act, financial assistance, uninsured dental services, [place] med assist, DSS and  health department  Provided resources for [place] uninsured accepting pcps like [facility], [facility] medicine [facility] [address], [facility], [facility], local urgent care centers, [facility], [facility], general medical clinics, [facility], [facility] urgent care plus others, medication resources, [facility] out patient [facility]ies and housing Provided [facility] contact information

## 2016-07-31 NOTE — ED Notes (Addendum)
Poison Control notified. Patient can experience: CNS depression, n/v. Tachycardia With severe exposure: Unresponsive, pinpoint pupils, QT prolongation Recommends: cardiac monitor, EKG, oral/IV hydration, valproic acid level, tylenol, aspirin Observation: 6 hours from the time we received patient

## 2016-07-31 NOTE — ED Notes (Signed)
Lab recollects sent to lab.

## 2016-07-31 NOTE — ED Notes (Signed)
MD spoke with pt.  Pt has calmed down and is resting.  MD placed orders for ativan, haldol, and oral zyprexa to be drawn and ready if pt becomes aggressive with staff.  MD will be kept up on pt's condition.  Pt may be moved to TCU or SAPU once repeat valproic acid is done.  Sitter at bedside.

## 2016-07-31 NOTE — ED Provider Notes (Signed)
[facility]-EMERGENCY DEPT Provider Note   CSN: [id number] Arrival date & time: 07/31/16  1300     History   Chief Complaint Chief Complaint  Patient presents with  . Suicidal  . Drug Overdose  . IVC    HPI Michael Finley is a 25 y.o. male.  Patient states that he took Depakote and Flexeril couple hours before coming to the emergency department because he wanted to kill himself.   The history is provided by the patient.  Drug Overdose  This is a new problem. The current episode started 1 to 2 hours ago. The problem occurs constantly. The problem has not changed since onset.Pertinent negatives include no chest pain, no abdominal pain and no headaches. Nothing aggravates the symptoms. Nothing relieves the symptoms. He has tried nothing for the symptoms.    Past Medical History:  Diagnosis Date  . Osteogenesis imperfecta   . PTSD (post-traumatic stress disorder)     There are no active problems to display for this patient.   Past Surgical History:  Procedure Laterality Date  . ELBOW ARTHROPLASTY Bilateral   . HAND ARTHROPLASTY    . HIP ARTHROSCOPY    . MANDIBLE RECONSTRUCTION         Home Medications    Prior to Admission medications   Medication Sig Start Date End Date Taking? Authorizing Provider  cyclobenzaprine (FLEXERIL) 5 MG tablet Take 1 tablet (5 mg total) by mouth every 8 (eight) hours as needed for muscle spasms. Patient not taking: Reported on 07/31/2016 12/04/15   Irean HongJade J Sung, MD  ibuprofen (ADVIL,MOTRIN) 800 MG tablet Take 1 tablet (800 mg total) by mouth every 8 (eight) hours as needed for moderate pain. Patient not taking: Reported on 07/31/2016 12/04/15   Irean HongJade J Sung, MD  oxyCODONE-acetaminophen (ROXICET) 5-325 MG tablet Take 1 tablet by mouth every 4 (four) hours as needed for severe pain. Patient not taking: Reported on 07/31/2016 12/04/15   Irean HongJade J Sung, MD  traMADol (ULTRAM) 50 MG tablet Take 1 tablet (50 mg total) by mouth every 6 (six) hours as  needed. Patient not taking: Reported on 07/31/2016 11/02/15   Delorise RoyalsJonathan D Cuthriell, PA-C    Family History No family history on file.  Social History Social History  Substance Use Topics  . Smoking status: Current Every Day Smoker    Packs/day: 0.50  . Smokeless tobacco: Never Used  . Alcohol use Yes     Comment: occasional     Allergies   Patient has no known allergies.   Review of Systems Review of Systems  Constitutional: Negative for appetite change and fatigue.  HENT: Negative for congestion, ear discharge and sinus pressure.   Eyes: Negative for discharge.  Respiratory: Negative for cough.   Cardiovascular: Negative for chest pain.  Gastrointestinal: Negative for abdominal pain and diarrhea.  Genitourinary: Negative for frequency and hematuria.  Musculoskeletal: Negative for back pain.  Skin: Negative for rash.  Neurological: Negative for seizures and headaches.  Psychiatric/Behavioral: Positive for suicidal ideas. Negative for hallucinations.     Physical Exam Updated Vital Signs BP 114/65 (BP Location: Left Arm)   Pulse 88   Temp 98.3 F (36.8 C) (Oral)   Resp 15   Ht 5\' 9"  (1.753 m)   Wt 230 lb (104.3 kg)   SpO2 97%   BMI 33.97 kg/m   Physical Exam  Constitutional: He is oriented to person, place, and time. He appears well-developed.  HENT:  Head: Normocephalic.  Eyes: Conjunctivae and EOM are  normal. No scleral icterus.  Neck: Neck supple. No thyromegaly present.  Cardiovascular: Normal rate and regular rhythm.  Exam reveals no gallop and no friction rub.   No murmur heard. Pulmonary/Chest: No stridor. He has no wheezes. He has no rales. He exhibits no tenderness.  Abdominal: He exhibits no distension. There is no tenderness. There is no rebound.  Musculoskeletal: Normal range of motion. He exhibits no edema.  Lymphadenopathy:    He has no cervical adenopathy.  Neurological: He is oriented to person, place, and time. He exhibits normal muscle  tone. Coordination normal.  Skin: No rash noted. No erythema.  Psychiatric:  Patient depressed and suicidal     ED Treatments / Results  Labs (all labs ordered are listed, but only abnormal results are displayed) Labs Reviewed  ACETAMINOPHEN LEVEL - Abnormal; Notable for the following:       Result Value   Acetaminophen (Tylenol), Serum <10 (*)    All other components within normal limits  CBC WITH DIFFERENTIAL/PLATELET  ETHANOL  COMPREHENSIVE METABOLIC PANEL  CBC WITH DIFFERENTIAL/PLATELET  RAPID URINE DRUG SCREEN, HOSP PERFORMED  SALICYLATE LEVEL  VALPROIC ACID LEVEL    EKG  EKG Interpretation  Date/Time:  Wednesday July 31 2016 13:27:14 EST Ventricular Rate:  99 PR Interval:    QRS Duration: 94 QT Interval:  336 QTC Calculation: 432 R Axis:   -37 Text Interpretation:  Sinus rhythm S1,S2,S3 pattern RSR' in V1 or V2, probably normal variant Baseline wander in lead(s) V2 V4 V6 Confirmed by Natosha Bou  MD, Temesgen Weightman ([id number]) on 07/31/2016 2:38:55 PM       Radiology No results found.  Procedures Procedures (including critical care time)  Medications Ordered in ED Medications  sodium chloride 0.9 % bolus 1,000 mL (1,000 mLs Intravenous New Bag/Given 07/31/16 1342)     Initial Impression / Assessment and Plan / ED Course  I have reviewed the triage vital signs and the nursing notes.  Pertinent labs & imaging results that were available during my care of the patient were reviewed by me and considered in my medical decision making (see chart for details).  Clinical Course     Poison control called and they stated the patient should be observed for 6 hours. If stable he can then be considered medically cleared.   Patient 6 hours are up at 7 PM  Final Clinical Impressions(s) / ED Diagnoses   Final diagnoses:  None    New Prescriptions New Prescriptions   No medications on file     Bethann BerkshireJoseph Demarr Kluever, MD 07/31/16 1546

## 2016-07-31 NOTE — ED Provider Notes (Addendum)
25 year old male here with intentional overdose on Depakote. Patient responding to internal stimuli. At time of sign out, plan is for repeat level at 6 hours with psychiatric disposition if Depakote level is decreasing and patient remained stable.  Repeat Depakote level 120, mildly increased from previous. Discussed with poison control. Will plan to repeat in 3-4 hours with continued monitoring at this time.  Third Depakote level is 95. Patient medically stable for psychiatric disposition.   Shaune Pollackameron Mikisha Roseland, MD 07/31/16 2310    Shaune Pollackameron Shloma Roggenkamp, MD 08/01/16 [phone]

## 2016-07-31 NOTE — ED Notes (Signed)
Patient gave verbal permission for me to speak to his wife. Patient states, "You can tell her whatever you want to."

## 2016-07-31 NOTE — ED Notes (Signed)
Poison Control recommends repeat valproic acid at 18:30

## 2016-07-31 NOTE — ED Notes (Signed)
Pt pulled out IV and demanded to have his things back.  "If you don't give them to me I'll just walk out without them'.  Pt has IVC papers.  Security and MD notified.  MD at bedside now.  Pt requires fluids and cardiac monitoring per poison control so pt cannot be moved to TCU.

## 2016-07-31 NOTE — ED Notes (Signed)
Bed: [id number] Expected date:  Expected time:  Means of arrival:  Comments: EMS- 25yo M, OD x 1.5 hrs ago

## 2016-07-31 NOTE — ED Notes (Signed)
Patient changed into purple scrubs/yellow socks. Patient and belongings wanded by security. 

## 2016-08-01 DIAGNOSIS — F17210 Nicotine dependence, cigarettes, uncomplicated: Secondary | ICD-10-CM

## 2016-08-01 DIAGNOSIS — F4325 Adjustment disorder with mixed disturbance of emotions and conduct: Secondary | ICD-10-CM

## 2016-08-01 DIAGNOSIS — Z79899 Other long term (current) drug therapy: Secondary | ICD-10-CM

## 2016-08-01 NOTE — [facility] Assessment (Signed)
[facility] Assessment Progress Note  Per Thedore MinsMojeed Akintayo, MD, this pt does not require psychiatric hospitalization at this time.  Pt presents under IVC initiated by law enforcement, which Dr Jannifer FranklinAkintayo has rescinded.  [facility]  This has been included in pt's discharge instructions.  Pt's nurse, Kendal Hymendie, has been notified.  Doylene Canninghomas Willet Schleifer, MA Triage Specialist [phone]

## 2016-08-01 NOTE — Consult Note (Signed)
[facility] Consult   Reason for Consult:  Suicidal ideations Referring Physician:  EDP Patient Identification: Michael Finley MRN:  [id number] Principal Diagnosis: Adjustment disorder with mixed disturbance of emotions and conduct Diagnosis:   Patient Active Problem List   Diagnosis Date Noted  . Adjustment disorder with mixed disturbance of emotions and conduct [F43.25] 08/01/2016    Priority: High    Total Time spent with patient: 45 minutes  Subjective:   Michael Finley is a 25 y.o. male patient does not warrant admission.  HPI: 25 yo male who presented to the ED after taking 6 Depakote and 4 Flexeril, stating it was not a suicidal attempt.  "You are a doctor, you know that's not going to kill me."  Denies suicidal ideations today along with suicide attempts, no homicidal ideations, and alcohol/drug abuse except cannabis.  He wants therapy, "that's why I came here."  Michael Finley reports he went to [facility] recently and they "did nothing for me."  He states, "I don't need medicine."  Suggested he go to [facility] for therapy, irritable and demands to leave.  Stable for discharge.  Past Psychiatric History: depression  Risk to Self: NO Risk to Others:  No Prior Inpatient Therapy:  Denies Prior Outpatient Therapy:  [facility]  Past Medical History:  Past Medical History:  Diagnosis Date  . Osteogenesis imperfecta   . PTSD (post-traumatic stress disorder)     Past Surgical History:  Procedure Laterality Date  . ELBOW ARTHROPLASTY Bilateral   . HAND ARTHROPLASTY    . HIP ARTHROSCOPY    . MANDIBLE RECONSTRUCTION     Family History: No family history on file. Family Psychiatric  History: none Social History:  History  Alcohol Use  . Yes    Comment: occasional     History  Drug Use  . Types: Marijuana    Social History   Social History  . Marital status: Married    Spouse name: N/A  . Number of children: N/A  . Years of education: N/A   Social  History Main Topics  . Smoking status: Current Every Day Smoker    Packs/day: 0.50  . Smokeless tobacco: Never Used  . Alcohol use Yes     Comment: occasional  . Drug use:     Types: Marijuana  . Sexual activity: Yes   Other Topics Concern  . None   Social History Narrative  . None   Additional Social History:    Allergies:  No Known Allergies  Labs:  Results for orders placed or performed during the hospital encounter of 07/31/16 (from the past 48 hour(s))  Rapid urine drug screen (hospital performed)     Status: Abnormal   Collection Time: 07/31/16  1:00 PM  Result Value Ref Range   Opiates NONE DETECTED NONE DETECTED   Cocaine NONE DETECTED NONE DETECTED   Benzodiazepines NONE DETECTED NONE DETECTED   Amphetamines NONE DETECTED NONE DETECTED   Tetrahydrocannabinol POSITIVE (A) NONE DETECTED   Barbiturates NONE DETECTED NONE DETECTED    Comment:        DRUG SCREEN FOR MEDICAL PURPOSES ONLY.  IF CONFIRMATION IS NEEDED FOR ANY PURPOSE, NOTIFY LAB WITHIN 5 DAYS.        LOWEST DETECTABLE LIMITS FOR URINE DRUG SCREEN Drug Class       Cutoff (ng/mL) Amphetamine      1000 Barbiturate      200 Benzodiazepine   989 Tricyclics       211 Opiates  300 Cocaine          300 THC              50   CBC with Differential/Platelet     Status: None   Collection Time: 07/31/16  2:41 PM  Result Value Ref Range   WBC 9.8 4.0 - 10.5 K/uL   RBC 5.25 4.22 - 5.81 MIL/uL   Hemoglobin 15.9 13.0 - 17.0 g/dL   HCT 45.6 39.0 - 52.0 %   MCV 86.9 78.0 - 100.0 fL   MCH 30.3 26.0 - 34.0 pg   MCHC 34.9 30.0 - 36.0 g/dL   RDW 12.9 11.5 - 15.5 %   Platelets 308 150 - 400 K/uL   Neutrophils Relative % 73 %   Neutro Abs 7.1 1.7 - 7.7 K/uL   Lymphocytes Relative 21 %   Lymphs Abs 2.0 0.7 - 4.0 K/uL   Monocytes Relative 6 %   Monocytes Absolute 0.6 0.1 - 1.0 K/uL   Eosinophils Relative 0 %   Eosinophils Absolute 0.0 0.0 - 0.7 K/uL   Basophils Relative 0 %   Basophils Absolute 0.0  0.0 - 0.1 K/uL  Acetaminophen level     Status: Abnormal   Collection Time: 07/31/16  2:41 PM  Result Value Ref Range   Acetaminophen (Tylenol), Serum <10 (L) 10 - 30 ug/mL    Comment:        THERAPEUTIC CONCENTRATIONS VARY SIGNIFICANTLY. A RANGE OF 10-30 ug/mL MAY BE AN EFFECTIVE CONCENTRATION FOR MANY PATIENTS. HOWEVER, SOME ARE BEST TREATED AT CONCENTRATIONS OUTSIDE THIS RANGE. ACETAMINOPHEN CONCENTRATIONS >150 ug/mL AT 4 HOURS AFTER INGESTION AND >50 ug/mL AT 12 HOURS AFTER INGESTION ARE OFTEN ASSOCIATED WITH TOXIC REACTIONS.   Ethanol     Status: None   Collection Time: 07/31/16  2:41 PM  Result Value Ref Range   Alcohol, Ethyl (B) <5 <5 mg/dL    Comment:        LOWEST DETECTABLE LIMIT FOR SERUM ALCOHOL IS 5 mg/dL FOR MEDICAL PURPOSES ONLY   Comprehensive metabolic panel     Status: None   Collection Time: 07/31/16  2:41 PM  Result Value Ref Range   Sodium 136 135 - 145 mmol/L   Potassium 4.5 3.5 - 5.1 mmol/L   Chloride 101 101 - 111 mmol/L   CO2 27 22 - 32 mmol/L   Glucose, Bld 88 65 - 99 mg/dL   BUN 10 6 - 20 mg/dL   Creatinine, Ser 1.00 0.61 - 1.24 mg/dL   Calcium 9.2 8.9 - 10.3 mg/dL   Total Protein 8.0 6.5 - 8.1 g/dL   Albumin 4.4 3.5 - 5.0 g/dL   AST 26 15 - 41 U/L   ALT 41 17 - 63 U/L   Alkaline Phosphatase 97 38 - 126 U/L   Total Bilirubin 0.7 0.3 - 1.2 mg/dL   GFR calc non Af Amer >60 >60 mL/min   GFR calc Af Amer >60 >60 mL/min    Comment: (NOTE) The eGFR has been calculated using the CKD EPI equation. This calculation has not been validated in all clinical situations. eGFR's persistently <60 mL/min signify possible Chronic Kidney Disease.    Anion gap 8 5 - 15  Salicylate level     Status: None   Collection Time: 07/31/16  2:41 PM  Result Value Ref Range   Salicylate Lvl <1.6 2.8 - 30.0 mg/dL    Comment: SLIGHT HEMOLYSIS  Valproic acid level     Status: None  Collection Time: 07/31/16  2:41 PM  Result Value Ref Range   Valproic Acid Lvl  100 50.0 - 100.0 ug/mL  Valproic acid level     Status: Abnormal   Collection Time: 07/31/16  6:52 PM  Result Value Ref Range   Valproic Acid Lvl 120 (H) 50.0 - 100.0 ug/mL  Valproic acid level     Status: None   Collection Time: 07/31/16 10:26 PM  Result Value Ref Range   Valproic Acid Lvl 95 50.0 - 100.0 ug/mL    Current Facility-Administered Medications  Medication Dose Route Frequency Provider Last Rate Last Dose  . 0.9 %  sodium chloride infusion   Intravenous Continuous Duffy Bruce, MD   Stopped at 08/01/16 [phone]  . acetaminophen (TYLENOL) tablet 650 mg  650 mg Oral Q4H PRN Duffy Bruce, MD      . alum & mag hydroxide-simeth (MAALOX/MYLANTA) 200-200-20 MG/5ML suspension 30 mL  30 mL Oral PRN Duffy Bruce, MD      . haloperidol lactate (HALDOL) injection 10 mg  10 mg Intramuscular Once PRN Duffy Bruce, MD      . LORazepam (ATIVAN) injection 2 mg  2 mg Intramuscular Once PRN Duffy Bruce, MD      . ondansetron [facility]) tablet 4 mg  4 mg Oral Q8H PRN Duffy Bruce, MD       Current Outpatient Prescriptions  Medication Sig Dispense Refill  . cyclobenzaprine (FLEXERIL) 5 MG tablet Take 1 tablet (5 mg total) by mouth every 8 (eight) hours as needed for muscle spasms. (Patient not taking: Reported on 07/31/2016) 15 tablet 0  . ibuprofen (ADVIL,MOTRIN) 800 MG tablet Take 1 tablet (800 mg total) by mouth every 8 (eight) hours as needed for moderate pain. (Patient not taking: Reported on 07/31/2016) 15 tablet 0  . oxyCODONE-acetaminophen (ROXICET) 5-325 MG tablet Take 1 tablet by mouth every 4 (four) hours as needed for severe pain. (Patient not taking: Reported on 07/31/2016) 15 tablet 0  . traMADol (ULTRAM) 50 MG tablet Take 1 tablet (50 mg total) by mouth every 6 (six) hours as needed. (Patient not taking: Reported on 07/31/2016) 5 tablet 0    Musculoskeletal: Strength & Muscle Tone: within normal limits Gait & Station: normal Patient leans: N/A  Psychiatric Specialty  Exam: Physical Exam  Constitutional: He is oriented to person, place, and time. He appears well-developed and well-nourished.  HENT:  Head: Normocephalic.  Neck: Normal range of motion.  Respiratory: Effort normal.  Musculoskeletal: Normal range of motion.  Neurological: He is alert and oriented to person, place, and time.  Skin: Skin is warm and dry.  Psychiatric: He has a normal mood and affect. His speech is normal and behavior is normal. Judgment and thought content normal. Cognition and memory are normal.    Review of Systems  Psychiatric/Behavioral: Positive for depression.  All other systems reviewed and are negative.   Blood pressure 116/71, pulse 95, temperature 98.3 F (36.8 C), temperature source Oral, resp. rate 16, height 5' 9"  (1.753 m), weight 104.3 kg (230 lb), SpO2 100 %.Body mass index is 33.97 kg/m.  General Appearance: Casual  Eye Contact:  Good  Speech:  Normal Rate  Volume:  Normal  Mood:  Irritable  Affect:  Congruent  Thought Process:  Coherent and Descriptions of Associations: Intact  Orientation:  Full (Time, Place, and Person)  Thought Content:  WDL  Suicidal Thoughts:  No  Homicidal Thoughts:  No  Memory:  Immediate;   Good Recent;   Good Remote;  Good  Judgement:  Fair  Insight:  Fair  Psychomotor Activity:  Normal  Concentration:  Concentration: Good and Attention Span: Good  Recall:  Good  Fund of Knowledge:  Fair  Language:  Good  Akathisia:  No  Handed:  Right  AIMS (if indicated):     Assets:  Housing Intimacy Leisure Time Physical Health Resilience Social Support  ADL's:  Intact  Cognition:  WNL  Sleep:        Treatment Plan Summary: Daily contact with patient to assess and evaluate symptoms and progress in treatment, Medication management and Plan adjusment disorder with mixed disturbance of emotions and conduct:  -Crisis stabilization -Medication management:  None started because he felt he did not need any -Individual  counseling -Referral to Family Service Disposition: No evidence of imminent risk to self or others at present.    Waylan Boga, NP 08/01/2016 9:45 AM  Patient seen face-to-face for psychiatric evaluation, chart reviewed and case discussed with the physician extender and developed treatment plan. Reviewed the information documented and agree with the treatment plan. Corena Pilgrim, MD

## 2016-08-01 NOTE — ED Notes (Addendum)
During rounds patient was very irritable and denied Suicide attempt.  Stated we werent doing anything to help him before he allowed the doctors to even speak.  Pt was noted to have chain necklace around his neck.  Pt refused to take it off.  As patient is for immediate discharge, we left the necklace in place.

## 2016-08-01 NOTE — ED Notes (Signed)
Pt transferred from main ed, presents for evaluation after taking Depakote pills and Flexeril earlier in the day, after argument with wife over child custody issues.  Pt A&O x 3, no distress noted, calm at present. Pt states he is refusing to answer any more questions until he speaks with the psychiatrist in the am.  Pt not forthcoming with information. Monitoring for safety, Q 15 min checks in effect.  SAFETY CHECK FOR CONTRABAND COMPLETED.

## 2016-08-01 NOTE — [facility] Suicide Risk Assessment (Signed)
Suicide Risk Assessment  Discharge Assessment   [facility]   Principal Problem: Adjustment disorder with mixed disturbance of emotions and conduct Discharge Diagnoses:  Patient Active Problem List   Diagnosis Date Noted  . Adjustment disorder with mixed disturbance of emotions and conduct [F43.25] 08/01/2016    Priority: High    Total Time spent with patient: 45 minutes   Musculoskeletal: Strength & Muscle Tone: within normal limits Gait & Station: normal Patient leans: N/A  Psychiatric Specialty Exam: Physical Exam  Constitutional: He is oriented to person, place, and time. He appears well-developed and well-nourished.  HENT:  Head: Normocephalic.  Neck: Normal range of motion.  Respiratory: Effort normal.  Musculoskeletal: Normal range of motion.  Neurological: He is alert and oriented to person, place, and time.  Skin: Skin is warm and dry.  Psychiatric: He has a normal mood and affect. His speech is normal and behavior is normal. Judgment and thought content normal. Cognition and memory are normal.    Review of Systems  Psychiatric/Behavioral: Positive for depression.  All other systems reviewed and are negative.   Blood pressure 116/71, pulse 95, temperature 98.3 F (36.8 C), temperature source Oral, resp. rate 16, height 5\' 9"  (1.753 m), weight 104.3 kg (230 lb), SpO2 100 %.Body mass index is 33.97 kg/m.  General Appearance: Casual  Eye Contact:  Good  Speech:  Normal Rate  Volume:  Normal  Mood:  Irritable  Affect:  Congruent  Thought Process:  Coherent and Descriptions of Associations: Intact  Orientation:  Full (Time, Place, and Person)  Thought Content:  WDL  Suicidal Thoughts:  No  Homicidal Thoughts:  No  Memory:  Immediate;   Good Recent;   Good Remote;   Good  Judgement:  Fair  Insight:  Fair  Psychomotor Activity:  Normal  Concentration:  Concentration: Good and Attention Span: Good  Recall:  Good  Fund of Knowledge:   Fair  Language:  Good  Akathisia:  No  Handed:  Right  AIMS (if indicated):     Assets:  Housing Intimacy Leisure Time Physical Health Resilience Social Support  ADL's:  Intact  Cognition:  WNL  Sleep:       Mental Status Per Nursing Assessment::   On Admission:   suicidal ideations  Demographic Factors:  Male and Adolescent or young adult  Loss Factors: NA  Historical Factors: NA  Risk Reduction Factors:   Sense of responsibility to family, Living with another person, especially a relative and Positive social support  Continued Clinical Symptoms:  Irritability  Cognitive Features That Contribute To Risk:  None    Suicide Risk:  Minimal: No identifiable suicidal ideation.  Patients presenting with no risk factors but with morbid ruminations; may be classified as minimal risk based on the severity of the depressive symptoms    Plan Of Care/Follow-up recommendations:  Activity:  as tolerated Diet:  heart healhty diet  Thetis Schwimmer, NP 08/01/2016, 11:40 AM

## 2016-08-01 NOTE — Discharge Instructions (Signed)
For your ongoing behavioral health needs you are advised to follow up with [facility].  New patients are seen at their walk-in clinic.  Walk-in hours are Monday - Friday from 8:00 am - 12:00 pm, and from 1:00 pm - 3:00 pm.  Walk-in patients are seen on a first come, first served basis, so try to arrive as early as possible for the best chance of being seen the same day.  There is an initial fee of $22.50: ° °     [facility] °     [address] °     [place], [place] [address] °     [phone] °

## 2016-09-03 ENCOUNTER — Emergency Department (HOSPITAL_COMMUNITY)
Admission: EM | Admit: 2016-09-03 | Discharge: 2016-09-04 | Disposition: A | Discharge status: Other Acute Inpatient Hospital | Payer: Self-pay | Attending: Emergency Medicine | Admitting: Emergency Medicine

## 2016-09-03 DIAGNOSIS — F322 Major depressive disorder, single episode, severe without psychotic features: Secondary | ICD-10-CM | POA: Insufficient documentation

## 2016-09-03 DIAGNOSIS — F17200 Nicotine dependence, unspecified, uncomplicated: Secondary | ICD-10-CM | POA: Insufficient documentation

## 2016-09-03 DIAGNOSIS — Z79899 Other long term (current) drug therapy: Secondary | ICD-10-CM | POA: Insufficient documentation

## 2016-09-03 LAB — ACETAMINOPHEN LEVEL: Acetaminophen (Tylenol), Serum: <10 ug/mL — ABNORMAL LOW (ref 10–30)

## 2016-09-03 LAB — CBC
HCT: 44.4 % (ref 39.0–52.0)
Hemoglobin: 15.8 g/dL (ref 13.0–17.0)
MCH: 30.7 pg (ref 26.0–34.0)
MCHC: 35.6 g/dL (ref 30.0–36.0)
MCV: 86.4 fL (ref 78.0–100.0)
PLATELETS: 284 10*3/uL (ref 150–400)
RBC: 5.14 MIL/uL (ref 4.22–5.81)
RDW: 13.3 % (ref 11.5–15.5)
WBC: 10.2 10*3/uL (ref 4.0–10.5)

## 2016-09-03 LAB — COMPREHENSIVE METABOLIC PANEL
ALT: 21 U/L (ref 17–63)
AST: 26 U/L (ref 15–41)
Albumin: 5 g/dL (ref 3.5–5.0)
Alkaline Phosphatase: 107 U/L (ref 38–126)
Anion gap: 8 (ref 5–15)
BILIRUBIN TOTAL: 0.6 mg/dL (ref 0.3–1.2)
BUN: 12 mg/dL (ref 6–20)
CHLORIDE: 107 mmol/L (ref 101–111)
CO2: 25 mmol/L (ref 22–32)
CREATININE: 0.88 mg/dL (ref 0.61–1.24)
Calcium: 9.3 mg/dL (ref 8.9–10.3)
Glucose, Bld: 90 mg/dL (ref 65–99)
Potassium: 3.7 mmol/L (ref 3.5–5.1)
Sodium: 140 mmol/L (ref 135–145)
TOTAL PROTEIN: 8.4 g/dL — AB (ref 6.5–8.1)

## 2016-09-03 LAB — RAPID URINE DRUG SCREEN, HOSP PERFORMED
AMPHETAMINES: NOT DETECTED
Barbiturates: NOT DETECTED
Benzodiazepines: NOT DETECTED
Cocaine: NOT DETECTED
OPIATES: NOT DETECTED
Tetrahydrocannabinol: POSITIVE — AB

## 2016-09-03 LAB — SALICYLATE LEVEL

## 2016-09-03 LAB — ETHANOL

## 2016-09-03 MED ORDER — ONDANSETRON HCL 4 MG PO TABS: 4.0000 mg | ORAL_TABLET | Freq: Three times a day (TID) | ORAL | Status: DC | PRN

## 2016-09-03 MED ORDER — IBUPROFEN 200 MG PO TABS: 600.0000 mg | ORAL_TABLET | Freq: Three times a day (TID) | ORAL | Status: DC | PRN

## 2016-09-03 MED ORDER — ACETAMINOPHEN 325 MG PO TABS: 650.0000 mg | ORAL_TABLET | ORAL | Status: DC | PRN

## 2016-09-03 NOTE — ED Notes (Signed)
Pt was refusing to have his blood drawn, states that he is just here to speak to the psychiatrist.  Explained to pt that the psychiatrist will not see him unless he is medically cleared and for that to happen his blood needs to be drawn.  Pt still refused and states that he will just stay in his room.  Pt made aware that he has IVC papers and still needs to have his blood drawn.  At this point security and [employer] was called to the room.  [facility] and is in the room to try to talk to pt.  Eventually, pt agreed to have his blood drawn.

## 2016-09-03 NOTE — ED Provider Notes (Signed)
[facility]-EMERGENCY DEPT Provider Note   CSN: [id number] Arrival date & time: 09/03/16  1811     History   Chief Complaint Chief Complaint  Patient presents with  . IVC    HPI Michael Finley is a 26 y.o. male.  HPI Patient presents under involuntary commitment. For IVC paperwork is been making suicidal statements and texturing to his mother pictures of him slice in his wrist. Reportedly was found an abandoned building painting. Patient states he had permission to be at the building and was just paining on a wall because his friend committed suicide there in August and he had a paining me because of it. Patient states he did not slit his wrists now. He shows the wrist but not cut. States he did previously slit his wrist just cannot [facility] for it. States he smokes marijuana but denies other drug use. Denies suicidal or homicidal thoughts. States he has not been able to fill his psychiatric medicines because he could not afford them. States he is a little upset because his wife said they would get back together if he took his treatment seriously at [facility] but states he did and she has not got back together with him.   Past Medical History:  Diagnosis Date  . Osteogenesis imperfecta   . PTSD (post-traumatic stress disorder)     Patient Active Problem List   Diagnosis Date Noted  . Adjustment disorder with mixed disturbance of emotions and conduct 08/01/2016    Past Surgical History:  Procedure Laterality Date  . ELBOW ARTHROPLASTY Bilateral   . HAND ARTHROPLASTY    . HIP ARTHROSCOPY    . MANDIBLE RECONSTRUCTION         Home Medications    Prior to Admission medications   Medication Sig Start Date End Date Taking? Authorizing Provider  cyclobenzaprine (FLEXERIL) 5 MG tablet Take 1 tablet (5 mg total) by mouth every 8 (eight) hours as needed for muscle spasms. Patient not taking: Reported on 09/03/2016 12/04/15   Irean Hong, MD  ibuprofen (ADVIL,MOTRIN) 800 MG tablet  Take 1 tablet (800 mg total) by mouth every 8 (eight) hours as needed for moderate pain. Patient not taking: Reported on 09/03/2016 12/04/15   Irean Hong, MD  oxyCODONE-acetaminophen (ROXICET) 5-325 MG tablet Take 1 tablet by mouth every 4 (four) hours as needed for severe pain. Patient not taking: Reported on 09/03/2016 12/04/15   Irean Hong, MD  traMADol (ULTRAM) 50 MG tablet Take 1 tablet (50 mg total) by mouth every 6 (six) hours as needed. Patient not taking: Reported on 09/03/2016 11/02/15   Delorise Royals Cuthriell, PA-C    Family History No family history on file.  Social History Social History  Substance Use Topics  . Smoking status: Current Every Day Smoker    Packs/day: 0.50  . Smokeless tobacco: Never Used  . Alcohol use Yes     Comment: occasional     Allergies   Patient has no known allergies.   Review of Systems Review of Systems  Constitutional: Negative for activity change and appetite change.  HENT: Negative for congestion.   Respiratory: Negative for chest tightness and shortness of breath.   Cardiovascular: Negative for chest pain.  Gastrointestinal: Negative for abdominal pain, diarrhea, nausea and vomiting.  Genitourinary: Negative for flank pain.  Musculoskeletal: Negative for back pain and neck stiffness.  Skin: Negative for rash.  Neurological: Negative for weakness, numbness and headaches.  Psychiatric/Behavioral: Negative for behavioral problems and suicidal  ideas.     Physical Exam Updated Vital Signs BP 130/80 (BP Location: Left Arm)   Pulse 77   Temp 97.1 F (36.2 C) (Oral)   Resp 18   SpO2 99%   Physical Exam  Constitutional: He appears well-developed.  HENT:  Head: Atraumatic.  Eyes: EOM are normal.  Neck: Neck supple.  Cardiovascular: Normal rate and regular rhythm.   Pulmonary/Chest: Effort normal.  Abdominal: Soft.  Musculoskeletal: Normal range of motion.  Scars from previous cutting on wrist on forearms. Hands and forearms have some  paint on them.  Neurological: He is alert.  Skin: Skin is warm.  Psychiatric: His behavior is normal.     ED Treatments / Results  Labs (all labs ordered are listed, but only abnormal results are displayed) Labs Reviewed  COMPREHENSIVE METABOLIC PANEL - Abnormal; Notable for the following:       Result Value   Total Protein 8.4 (*)    All other components within normal limits  ACETAMINOPHEN LEVEL - Abnormal; Notable for the following:    Acetaminophen (Tylenol), Serum <10 (*)    All other components within normal limits  RAPID URINE DRUG SCREEN, HOSP PERFORMED - Abnormal; Notable for the following:    Tetrahydrocannabinol POSITIVE (*)    All other components within normal limits  ETHANOL  SALICYLATE LEVEL  CBC    EKG  EKG Interpretation None       Radiology No results found.  Procedures Procedures (including critical care time)  Medications Ordered in ED Medications  acetaminophen (TYLENOL) tablet 650 mg (not administered)  ibuprofen (ADVIL,MOTRIN) tablet 600 mg (not administered)  ondansetron (ZOFRAN) tablet 4 mg (not administered)     Initial Impression / Assessment and Plan / ED Course  I have reviewed the triage vital signs and the nursing notes.  Pertinent labs & imaging results that were available during my care of the patient were reviewed by me and considered in my medical decision making (see chart for details).  Clinical Course     Patient brought in under involuntary commitment. Reportedly depressed and suicidal patient denies being suicidal. At this point is medically cleared. To be seen by TTS.  Final Clinical Impressions(s) / ED Diagnoses   Final diagnoses:  Depression, unspecified depression type    New Prescriptions New Prescriptions   No medications on file     Benjiman CoreNathan Shiron Whetsel, MD 09/03/16 2135
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
## 2016-09-03 NOTE — ED Notes (Signed)
Bed: [id number] Expected date:  Expected time:  Means of arrival:  Comments: ivc patient

## 2016-09-03 NOTE — BH Assessment (Addendum)
Tele Assessment Note   Michael Finley is a 26 y.o. male who was IVC by [employer]. Pt currently denies S/I and H/I. Pt states that his wife was upset with him and showed his mother pictures of his cut wrist that he did weeks ago. Pt states he was painting in a warehouse this afternoon when the police showed up to bring him the Ed. Pt states he goes there to paint as a coping skill to deal with depression and frustration. Pt is currently receiving outpatient therapy at [facility] but is not on any medications. Pt states he has been hospitalized several times in the past few months for depression and his last visit was the hospital in [facility]. Pt stated he can not afford the prescription so that is why he is not taking the medications. Pt also reports smoking marijuana daily to cope with depression. Pt denies AV hallucinations.   Pt states his primary stressors are that his wife left him and took their son with her, he has until the end of the month to move out of his house, lost his job, and one of his close friends just committed suicide.  Pt states he does not sleep well night averaging 4 hours of sleep. Pt also states his appetite is gone and he has lost 30lbs in the past 5 months. Pt states he does not have a lot of family support. Pt states he has no access to weapons and would like to return home.   Pt was dressed in hospital scrubs. Pt was alert and oriented. Pt had poor eye contact and kept blanket over his head during the assessment. Pt's thought process was logical and coherent. Pt did not appear to be responding to any internal stimuli. Pt was pleasant and cooperative with therapist.   Diagnosis: GAD, MDD  Past Medical History:  Past Medical History:  Diagnosis Date  . Osteogenesis imperfecta   . PTSD (post-traumatic stress disorder)     Past Surgical History:  Procedure Laterality Date  . ELBOW ARTHROPLASTY Bilateral   . HAND ARTHROPLASTY    . HIP ARTHROSCOPY    . MANDIBLE RECONSTRUCTION       Family History: No family history on file.  Social History:  reports that he has been smoking.  He has been smoking about 0.50 packs per day. He has never used smokeless tobacco. He reports that he drinks alcohol. He reports that he uses drugs, including Marijuana.  Additional Social History:  Alcohol / Drug Use Pain Medications: see MAR  Prescriptions: see MAR Over the Counter: see MAR History of alcohol / drug use?: Yes Longest period of sobriety (when/how long): unknown  Negative Consequences of Use: Personal relationships Substance #1 Name of Substance 1: Marijuana  1 - Age of First Use: 18 1 - Amount (size/oz): 2-3 blunts 1 - Frequency: daily 1 - Duration: ongoing  1 - Last Use / Amount: 09/02/16  CIWA: CIWA-Ar BP: 130/80 Pulse Rate: 77 COWS:    PATIENT STRENGTHS: (choose at least two) Ability for insight Average or above average intelligence Capable of independent living Communication skills General fund of knowledge Motivation for treatment/growth Physical Health  Allergies: No Known Allergies  Home Medications:  (Not in a hospital admission)  OB/GYN Status:  No LMP for male patient.  General Assessment Data Location of Assessment: [facility] TTS Assessment: In system Is this a Tele or Face-to-Face Assessment?: Face-to-Face Is this an Initial Assessment or a Re-assessment for this encounter?: Initial Assessment Marital  status: Married Living Arrangements: Alone Can pt return to current living arrangement?: Yes Admission Status: Involuntary Is patient capable of signing voluntary admission?: No Referral Source:  (mother and wife ) Insurance type: none      Crisis Care Plan Living Arrangements: Alone Name of Psychiatrist: none currently Name of Therapist: [facility]  Education Status Is patient currently in school?: No Current Grade: na Highest grade of school patient has completed: 12th Name of school: na Contact person: na  Risk to self with the past  6 months Suicidal Ideation: No Has patient been a risk to self within the past 6 months prior to admission? : Yes Suicidal Intent: No-Not Currently/Within Last 6 Months Has patient had any suicidal intent within the past 6 months prior to admission? : Yes Is patient at risk for suicide?: No Suicidal Plan?: No Has patient had any suicidal plan within the past 6 months prior to admission? : Yes Access to Means: No What has been your use of drugs/alcohol within the last 12 months?: ongoing  Previous Attempts/Gestures: No How many times?: 2 Other Self Harm Risks: denies currently Triggers for Past Attempts: None known Intentional Self Injurious Behavior: Cutting Comment - Self Injurious Behavior: pt reports hx of cutting wrist Family Suicide History: No (close friend recently committed suicide) Recent stressful life event(s): Loss (Comment), Job Loss, Conflict (Comment) Persecutory voices/beliefs?: No Depression: Yes Depression Symptoms: Insomnia, Tearfulness, Isolating, Feeling worthless/self pity, Feeling angry/irritable Substance abuse history and/or treatment for substance abuse?: No Suicide prevention information given to non-admitted patients: Not applicable  Risk to Others within the past 6 months Homicidal Ideation: No Does patient have any lifetime risk of violence toward others beyond the six months prior to admission? : No Thoughts of Harm to Others: No Current Homicidal Intent: No Current Homicidal Plan: No Access to Homicidal Means: No Identified Victim: na History of harm to others?: No Assessment of Violence: None Noted Violent Behavior Description: na Does patient have access to weapons?: No Criminal Charges Pending?: No Does patient have a court date: No Is patient on probation?: No  Psychosis Hallucinations: None noted Delusions: None noted  Mental Status Report Appearance/Hygiene: In scrubs Eye Contact: Poor Motor Activity: Freedom of movement Speech:  Logical/coherent Level of Consciousness: Irritable Mood: Irritable Affect: Appropriate to circumstance Anxiety Level: None Thought Processes: Coherent Judgement: Unimpaired Orientation: Person, Place, Time, Situation Obsessive Compulsive Thoughts/Behaviors: None  Cognitive Functioning Concentration: Normal Memory: Recent Intact, Remote Intact IQ: Average Insight: Fair Impulse Control: Fair Appetite: Poor Weight Loss: 30 (pt reports have lost 30lbs in past 5 months) Weight Gain: 0 Sleep: Decreased Total Hours of Sleep: 4 Vegetative Symptoms: None  ADLScreening [facility] Assessment Services) Patient's cognitive ability adequate to safely complete daily activities?: Yes Patient able to express need for assistance with ADLs?: Yes Independently performs ADLs?: Yes (appropriate for developmental age)  Prior Inpatient Therapy Prior Inpatient Therapy: Yes Prior Therapy Dates: 2017 Prior Therapy Facilty/Provider(s): multpile-4 different places Reason for Treatment: SI; depression  Prior Outpatient Therapy Prior Outpatient Therapy: Yes Prior Therapy Dates: currently Prior Therapy Facilty/Provider(s): [facility] Reason for Treatment: depression Does patient have an ACCT team?: No Does patient have Intensive In-House Services?  : No Does patient have [facility] services? : No Does patient have P4CC services?: No  ADL Screening (condition at time of admission) Patient's cognitive ability adequate to safely complete daily activities?: Yes Is the patient deaf or have difficulty hearing?: No Does the patient have difficulty seeing, even when wearing glasses/contacts?: No Does the patient have  difficulty concentrating, remembering, or making decisions?: No Patient able to express need for assistance with ADLs?: Yes Does the patient have difficulty dressing or bathing?: No Independently performs ADLs?: Yes (appropriate for developmental age) Does the patient have difficulty walking or climbing  stairs?: No Weakness of Legs: None Weakness of Arms/Hands: None       Abuse/Neglect Assessment (Assessment to be complete while patient is alone) Physical Abuse: Denies Verbal Abuse: Denies Sexual Abuse: Denies Exploitation of patient/patient's resources: Denies Self-Neglect: Denies Values / Beliefs Cultural Requests During Hospitalization: None Spiritual Requests During Hospitalization: None   Advance Directives (For Healthcare) Does Patient Have a Medical Advance Directive?: No Would patient like information on creating a medical advance directive?: No - Patient declined    Additional Information 1:1 In Past 12 Months?: No CIRT Risk: No Elopement Risk: No Does patient have medical clearance?: Yes     Disposition: Gave clinical report to Donell Sievert who recommends pt be seen by psychiatry in the morning for an evaluation.   Orlie Pollen, LPC, NCC,  LCAS-A Therapeutic Triage Specialist  09/03/2016 10:35 PM      Evlyn Courier 09/03/2016 10:16 PM

## 2016-09-03 NOTE — ED Triage Notes (Signed)
Pt brought in IVC by [employer]. Someone called [employer] and stated that the pt was in an abandoned building cutting his wrists. Pt denies HI/SI. No obvious injuries. Pt states he was just in an abandoned painting.

## 2016-09-04 DIAGNOSIS — R45851 Suicidal ideations: Secondary | ICD-10-CM

## 2016-09-04 DIAGNOSIS — Z79899 Other long term (current) drug therapy: Secondary | ICD-10-CM

## 2016-09-04 DIAGNOSIS — F322 Major depressive disorder, single episode, severe without psychotic features: Secondary | ICD-10-CM | POA: Diagnosis present

## 2016-09-04 DIAGNOSIS — F4325 Adjustment disorder with mixed disturbance of emotions and conduct: Secondary | ICD-10-CM

## 2016-09-04 DIAGNOSIS — Z9889 Other specified postprocedural states: Secondary | ICD-10-CM

## 2016-09-04 DIAGNOSIS — F17210 Nicotine dependence, cigarettes, uncomplicated: Secondary | ICD-10-CM

## 2016-09-04 MED ORDER — DIPHENHYDRAMINE HCL 50 MG/ML IJ SOLN
50.0000 mg | Freq: Once | INTRAMUSCULAR | Status: AC
  Administered 2016-09-04: 50 mg via INTRAMUSCULAR
  Filled 2016-09-04: qty 1

## 2016-09-04 MED ORDER — ZIPRASIDONE MESYLATE 20 MG IM SOLR
20.0000 mg | Freq: Once | INTRAMUSCULAR | Status: AC
  Administered 2016-09-04: 20 mg via INTRAMUSCULAR
  Filled 2016-09-04: qty 20

## 2016-09-04 MED ORDER — LORAZEPAM 2 MG/ML IJ SOLN
2.0000 mg | Freq: Once | INTRAMUSCULAR | Status: AC
  Administered 2016-09-04: 2 mg via INTRAMUSCULAR
  Filled 2016-09-04: qty 1

## 2016-09-04 MED ORDER — OLANZAPINE 10 MG PO TBDP: 10.0000 mg | ORAL_TABLET | Freq: Three times a day (TID) | ORAL | Status: DC | PRN

## 2016-09-04 MED ORDER — TRAZODONE HCL 100 MG PO TABS: 100.0000 mg | ORAL_TABLET | Freq: Every day | ORAL | Status: DC

## 2016-09-04 MED ORDER — STERILE WATER FOR INJECTION IJ SOLN
INTRAMUSCULAR | Status: AC
  Administered 2016-09-04: 1.2 mL
  Filled 2016-09-04: qty 10

## 2016-09-04 MED ORDER — LAMOTRIGINE 25 MG PO TABS: 25.0000 mg | ORAL_TABLET | Freq: Every day | ORAL | Status: DC
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
## 2016-09-04 NOTE — ED Notes (Signed)
Pt's mother came to the hospital to visit. Writer requested that she call back in a few hours as pt has recently fell asleep.

## 2016-09-04 NOTE — ED Notes (Signed)
Pt recently woke up and is irritable wanting to talk to the doctor and leave the hospital. Pt blames his wife and mother for having him come to the hospital. Pt reports that [facility] and has been off of his medication because "I can't afford them." He says that he goes to OP treatment for coping skills for anger. When asked about suicidal thoughts, homicidal thoughts and hallucinations, pt verbally denied.

## 2016-09-04 NOTE — Progress Notes (Signed)
09/04/16 1350:  LRT went to pt room to offer activities, pt was sleep.  Caroll RancherMarjette Katonya Blecher, LRT/CTRS

## 2016-09-04 NOTE — BH Assessment (Signed)
[facility] Assessment Progress Note  Per Michael MinsMojeed Akintayo, MD, this pt requires psychiatric hospitalization at this time.  Pt presents under IVC initiated by law enforcement, which Michael Finley has upheld.  Michael Finley calls from [facility] to report that pt has been accepted to their facility by Michael Finley to Rm [id number].  Michael MeansJamison Lord, Michael Finley, concurs with this decision.  Pt's nurse, Michael Finley, has been notified, and agrees to call report to [phone].  [facility]   Michael Canninghomas Javarus Dorner, MA Triage Specialist [phone]

## 2016-09-04 NOTE — ED Notes (Addendum)
Pt has been cursing "sorry ass bitch" and yelling at staff. He has been upset talking to his mother on the phone. Pt is wanting to leave blaming others that he is in the hospital.

## 2018-07-28 ENCOUNTER — Emergency Department
Admission: EM | Admit: 2018-07-28 | Discharge: 2018-07-29 | Disposition: A | Discharge status: Home / Self Care | Payer: Self-pay | Attending: Emergency Medicine | Admitting: Emergency Medicine

## 2018-07-28 ENCOUNTER — Other Ambulatory Visit: Payer: Self-pay

## 2018-07-28 DIAGNOSIS — F329 Major depressive disorder, single episode, unspecified: Secondary | ICD-10-CM | POA: Insufficient documentation

## 2018-07-28 DIAGNOSIS — F339 Major depressive disorder, recurrent, unspecified: Secondary | ICD-10-CM

## 2018-07-28 DIAGNOSIS — F4329 Adjustment disorder with other symptoms: Secondary | ICD-10-CM

## 2018-07-28 DIAGNOSIS — F17210 Nicotine dependence, cigarettes, uncomplicated: Secondary | ICD-10-CM | POA: Insufficient documentation

## 2018-07-28 DIAGNOSIS — Z008 Encounter for other general examination: Secondary | ICD-10-CM | POA: Insufficient documentation

## 2018-07-28 LAB — ETHANOL: Alcohol, Ethyl (B): <10 mg/dL (ref <10)

## 2018-07-28 LAB — CBC WITH DIFFERENTIAL/PLATELET
Abs Immature Granulocytes: 0.1 10*3/uL — ABNORMAL HIGH (ref 0.00–0.07)
Basophils Absolute: 0.1 10*3/uL (ref 0.0–0.1)
Basophils Relative: 0 %
Eosinophils Absolute: 0.1 10*3/uL (ref 0.0–0.5)
Eosinophils Relative: 1 %
HEMATOCRIT: 47.1 % (ref 39.0–52.0)
Hemoglobin: 15.7 g/dL (ref 13.0–17.0)
Immature Granulocytes: 1 %
Lymphocytes Relative: 11 %
Lymphs Abs: 1.8 10*3/uL (ref 0.7–4.0)
MCH: 30.4 pg (ref 26.0–34.0)
MCHC: 33.3 g/dL (ref 30.0–36.0)
MCV: 91.1 fL (ref 80.0–100.0)
MONO ABS: 0.8 10*3/uL (ref 0.1–1.0)
Monocytes Relative: 5 %
Neutro Abs: 12.8 10*3/uL — ABNORMAL HIGH (ref 1.7–7.7)
Neutrophils Relative %: 82 %
Platelets: 357 10*3/uL (ref 150–400)
RBC: 5.17 MIL/uL (ref 4.22–5.81)
RDW: 13.6 % (ref 11.5–15.5)
WBC: 15.6 10*3/uL — ABNORMAL HIGH (ref 4.0–10.5)
nRBC: 0 % (ref 0.0–0.2)

## 2018-07-28 LAB — COMPREHENSIVE METABOLIC PANEL
ALT: 27 U/L (ref 0–44)
AST: 56 U/L — ABNORMAL HIGH (ref 15–41)
Albumin: 4.3 g/dL (ref 3.5–5.0)
Alkaline Phosphatase: 70 U/L (ref 38–126)
Anion gap: 6 (ref 5–15)
BUN: 6 mg/dL (ref 6–20)
CALCIUM: 9.1 mg/dL (ref 8.9–10.3)
CO2: 29 mmol/L (ref 22–32)
CREATININE: 0.85 mg/dL (ref 0.61–1.24)
Chloride: 107 mmol/L (ref 98–111)
GFR calc Af Amer: >60 mL/min (ref >60)
GFR calc non Af Amer: >60 mL/min (ref >60)
Glucose, Bld: 95 mg/dL (ref 70–99)
Potassium: 4.2 mmol/L (ref 3.5–5.1)
Sodium: 142 mmol/L (ref 135–145)
Total Bilirubin: 0.5 mg/dL (ref 0.3–1.2)
Total Protein: 7 g/dL (ref 6.5–8.1)

## 2018-07-28 LAB — ACETAMINOPHEN LEVEL: Acetaminophen (Tylenol), Serum: <10 ug/mL — ABNORMAL LOW (ref 10–30)

## 2018-07-28 LAB — SALICYLATE LEVEL: Salicylate Lvl: <7 mg/dL (ref 2.8–30.0)

## 2018-07-28 MED ORDER — SERTRALINE HCL 50 MG PO TABS: 50.0000 mg | ORAL_TABLET | Freq: Every day | ORAL | 2 refills | Status: DC

## 2018-07-28 MED ORDER — PRAZOSIN HCL 1 MG PO CAPS: 1.0000 mg | ORAL_CAPSULE | Freq: Every day | ORAL | 0 refills | Status: DC

## 2018-07-28 NOTE — ED Notes (Signed)
[facility]  [phone]
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
## 2018-07-28 NOTE — Discharge Instructions (Signed)
Please follow-up with [facility].  Their contact information is on the other sheet of paper we gave you.  See if Zoloft antidepressant once a day will help and you can try prazosin 1 mg before bed for nightmares.  The psychiatrist suggest you follow-up with vocational rehab clinic as well.  Tell them that you have PTSD and osteogenesis imperfecta.  Their phone number is [phone].  The website is [email or web address].  Please return here if you have any further problems

## 2018-07-28 NOTE — ED Notes (Signed)
Patient talking with SOC 

## 2018-07-28 NOTE — ED Notes (Signed)
Pt. Resting in room watching tv.

## 2018-07-28 NOTE — ED Notes (Signed)
Patient talking to TTS 
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
## 2018-07-28 NOTE — ED Notes (Signed)
Patient talking to Dr. Darnelle CatalanMalinda

## 2018-07-29 NOTE — ED Notes (Signed)
Pt. Given bus pass.

## 2018-11-03 ENCOUNTER — Emergency Department
Admission: EM | Admit: 2018-11-03 | Discharge: 2018-11-03 | Disposition: A | Discharge status: Home / Self Care | Payer: Self-pay | Attending: Emergency Medicine | Admitting: Emergency Medicine

## 2018-11-03 ENCOUNTER — Emergency Department: Payer: Self-pay

## 2018-11-03 ENCOUNTER — Other Ambulatory Visit: Payer: Self-pay

## 2018-11-03 ENCOUNTER — Encounter: Payer: Self-pay | Admitting: Emergency Medicine

## 2018-11-03 DIAGNOSIS — F17200 Nicotine dependence, unspecified, uncomplicated: Secondary | ICD-10-CM | POA: Insufficient documentation

## 2018-11-03 DIAGNOSIS — Z79899 Other long term (current) drug therapy: Secondary | ICD-10-CM | POA: Insufficient documentation

## 2018-11-03 DIAGNOSIS — M25559 Pain in unspecified hip: Secondary | ICD-10-CM

## 2018-11-03 DIAGNOSIS — M25552 Pain in left hip: Secondary | ICD-10-CM | POA: Insufficient documentation

## 2018-11-03 DIAGNOSIS — Q780 Osteogenesis imperfecta: Secondary | ICD-10-CM | POA: Insufficient documentation

## 2018-11-03 LAB — CBC
HCT: 52.1 % — ABNORMAL HIGH (ref 39.0–52.0)
Hemoglobin: 17.5 g/dL — ABNORMAL HIGH (ref 13.0–17.0)
MCH: 30 pg (ref 26.0–34.0)
MCHC: 33.6 g/dL (ref 30.0–36.0)
MCV: 89.4 fL (ref 80.0–100.0)
Platelets: 320 10*3/uL (ref 150–400)
RBC: 5.83 MIL/uL — ABNORMAL HIGH (ref 4.22–5.81)
RDW: 12.9 % (ref 11.5–15.5)
WBC: 7.4 10*3/uL (ref 4.0–10.5)
nRBC: 0 % (ref 0.0–0.2)

## 2018-11-03 LAB — COMPREHENSIVE METABOLIC PANEL
ALT: 28 U/L (ref 0–44)
AST: 29 U/L (ref 15–41)
Albumin: 3.7 g/dL (ref 3.5–5.0)
Alkaline Phosphatase: 74 U/L (ref 38–126)
Anion gap: 8 (ref 5–15)
BUN: 9 mg/dL (ref 6–20)
CO2: 25 mmol/L (ref 22–32)
CREATININE: 1.06 mg/dL (ref 0.61–1.24)
Calcium: 8.7 mg/dL — ABNORMAL LOW (ref 8.9–10.3)
Chloride: 106 mmol/L (ref 98–111)
GFR calc Af Amer: >60 mL/min (ref >60)
GFR calc non Af Amer: >60 mL/min (ref >60)
Glucose, Bld: 112 mg/dL — ABNORMAL HIGH (ref 70–99)
Potassium: 4 mmol/L (ref 3.5–5.1)
Sodium: 139 mmol/L (ref 135–145)
Total Bilirubin: 0.6 mg/dL (ref 0.3–1.2)
Total Protein: 6.4 g/dL — ABNORMAL LOW (ref 6.5–8.1)

## 2018-11-03 LAB — URINALYSIS, COMPLETE (UACMP) WITH MICROSCOPIC
BILIRUBIN URINE: NEGATIVE
Bacteria, UA: NONE SEEN
Glucose, UA: NEGATIVE mg/dL
Hgb urine dipstick: NEGATIVE
Ketones, ur: NEGATIVE mg/dL
Leukocytes,Ua: NEGATIVE
Nitrite: NEGATIVE
Protein, ur: NEGATIVE mg/dL
SPECIFIC GRAVITY, URINE: 1.02 (ref 1.005–1.030)
Squamous Epithelial / HPF: NONE SEEN (ref 0–5)
WBC, UA: NONE SEEN WBC/hpf (ref 0–5)
pH: 7 (ref 5.0–8.0)

## 2018-11-03 LAB — LIPASE, BLOOD: Lipase: 30 U/L (ref 11–51)

## 2018-11-03 MED ORDER — NAPROXEN 500 MG PO TABS: 500.0000 mg | ORAL_TABLET | Freq: Two times a day (BID) | ORAL | 2 refills | Status: DC

## 2018-11-03 MED ORDER — KETOROLAC TROMETHAMINE 30 MG/ML IJ SOLN
30.0000 mg | Freq: Once | INTRAMUSCULAR | Status: AC
  Administered 2018-11-03: 30 mg via INTRAMUSCULAR

## 2018-11-03 MED ORDER — KETOROLAC TROMETHAMINE 30 MG/ML IJ SOLN
INTRAMUSCULAR | Status: AC
  Filled 2018-11-03: qty 1

## 2018-11-03 MED ORDER — SODIUM CHLORIDE 0.9% FLUSH: 3.0000 mL | Freq: Once | INTRAVENOUS | Status: DC
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
## 2019-05-23 ENCOUNTER — Other Ambulatory Visit: Payer: Self-pay

## 2019-05-23 ENCOUNTER — Emergency Department: Payer: Self-pay

## 2019-05-23 ENCOUNTER — Emergency Department
Admission: EM | Admit: 2019-05-23 | Discharge: 2019-05-23 | Disposition: A | Discharge status: Home / Self Care | Payer: Self-pay | Attending: Emergency Medicine | Admitting: Emergency Medicine

## 2019-05-23 DIAGNOSIS — Z96622 Presence of left artificial elbow joint: Secondary | ICD-10-CM | POA: Insufficient documentation

## 2019-05-23 DIAGNOSIS — F17210 Nicotine dependence, cigarettes, uncomplicated: Secondary | ICD-10-CM | POA: Insufficient documentation

## 2019-05-23 DIAGNOSIS — S60221A Contusion of right hand, initial encounter: Secondary | ICD-10-CM | POA: Insufficient documentation

## 2019-05-23 DIAGNOSIS — Y929 Unspecified place or not applicable: Secondary | ICD-10-CM | POA: Insufficient documentation

## 2019-05-23 DIAGNOSIS — Y9389 Activity, other specified: Secondary | ICD-10-CM | POA: Insufficient documentation

## 2019-05-23 DIAGNOSIS — W2209XA Striking against other stationary object, initial encounter: Secondary | ICD-10-CM | POA: Insufficient documentation

## 2019-05-23 DIAGNOSIS — Y999 Unspecified external cause status: Secondary | ICD-10-CM | POA: Insufficient documentation

## 2019-05-23 MED ORDER — IBUPROFEN 800 MG PO TABS
800.0000 mg | ORAL_TABLET | Freq: Once | ORAL | Status: AC
  Administered 2019-05-23: 14:00:00 800 mg via ORAL
  Filled 2019-05-23: qty 1

## 2019-05-23 MED ORDER — TRAMADOL HCL 50 MG PO TABS
50.0000 mg | ORAL_TABLET | Freq: Once | ORAL | Status: AC
  Administered 2019-05-23: 14:00:00 50 mg via ORAL
  Filled 2019-05-23: qty 1

## 2019-05-23 MED ORDER — IBUPROFEN 600 MG PO TABS: 600.0000 mg | ORAL_TABLET | Freq: Four times a day (QID) | ORAL | 0 refills | Status: DC | PRN

## 2019-05-23 NOTE — ED Provider Notes (Signed)
[facility] EMERGENCY DEPARTMENT Provider Note   CSN: [id number] Arrival date & time: 05/23/19  1237     History   Chief Complaint Chief Complaint  Patient presents with  . Hand Injury    HPI Michael Finley is a 28 y.o. male presents to the emergency department for evaluation of right hand pain.  Patient states yesterday punched a wall.  Feels as if he has fractured his fifth metacarpal.  Points to the fourth and fifth metacarpal region.  His pain is moderate.  He is not any medications for pain.  Denies any allergies.  No numbness or tingling.  No other injury to his body.     HPI  Past Medical History:  Diagnosis Date  . Osteogenesis imperfecta   . PTSD (post-traumatic stress disorder)     Patient Active Problem List   Diagnosis Date Noted  . Major depressive disorder, single episode, severe (HCC) 09/04/2016    Past Surgical History:  Procedure Laterality Date  . ELBOW ARTHROPLASTY Bilateral   . HAND ARTHROPLASTY    . HIP ARTHROSCOPY    . MANDIBLE RECONSTRUCTION    . STOMACH SURGERY     to remove ulcers.        Home Medications    Prior to Admission medications   Medication Sig Start Date End Date Taking? Authorizing Provider  ibuprofen (ADVIL) 600 MG tablet Take 1 tablet (600 mg total) by mouth every 6 (six) hours as needed for moderate pain. 05/23/19   Evon Slack, PA-C  naproxen (NAPROSYN) 500 MG tablet Take 1 tablet (500 mg total) by mouth 2 (two) times daily with a meal. 11/03/18   Jene Every, MD  prazosin (MINIPRESS) 1 MG capsule Take 1 capsule (1 mg total) by mouth at bedtime. 07/28/18   Arnaldo Natal, MD  sertraline (ZOLOFT) 50 MG tablet Take 1 tablet (50 mg total) by mouth daily. 07/28/18 07/28/19  Arnaldo Natal, MD    Family History No family history on file.  Social History Social History   Tobacco Use  . Smoking status: Current Every Day Smoker    Packs/day: 0.50  . Smokeless tobacco: Never Used  Substance  Use Topics  . Alcohol use: Yes    Comment: occasional  . Drug use: Yes    Types: Marijuana     Allergies   Patient has no known allergies.   Review of Systems Review of Systems  Constitutional: Negative for fever.  Musculoskeletal: Positive for arthralgias and myalgias.  Skin: Negative for wound.  Neurological: Negative for numbness.     Physical Exam Updated Vital Signs BP 139/83   Pulse 81   Temp 98.6 F (37 C) (Oral)   Resp 14   Wt 86.2 kg   SpO2 99%   BMI 28.06 kg/m   Physical Exam Constitutional:      Appearance: He is well-developed.  HENT:     Head: Normocephalic and atraumatic.  Eyes:     Conjunctiva/sclera: Conjunctivae normal.  Neck:     Musculoskeletal: Normal range of motion.  Cardiovascular:     Rate and Rhythm: Normal rate.  Pulmonary:     Effort: Pulmonary effort is normal. No respiratory distress.  Musculoskeletal:     Comments: Right hand with swelling along the fourth and fifth metacarpal region.  Tenderness along the MCP joints down to the carpal metacarpal regions of the ulnar aspect of the hand.  No tendon deficits noted.  No rotational deformities noted.  Unable  to make a full fist.  No skin breakdown noted.  Skin:    General: Skin is warm.     Findings: No rash.  Neurological:     Mental Status: He is alert and oriented to person, place, and time.  Psychiatric:        Behavior: Behavior normal.        Thought Content: Thought content normal.      ED Treatments / Results  Labs (all labs ordered are listed, but only abnormal results are displayed) Labs Reviewed - No data to display  EKG None  Radiology Dg Hand Complete Right  Result Date: 05/23/2019 CLINICAL DATA:  Arm injury. Punched a wall. Pain to lateral aspect of right hand. EXAM: RIGHT HAND - COMPLETE 3+ VIEW COMPARISON:  None. FINDINGS: Chronic deformity involving the second and third metacarpal bones from previous hardware fixation with fusion of the trapezium,  trapezoid and base of second metacarpal bone. No acute fractures identified at this time. No radio-opaque foreign bodies. IMPRESSION: 1. No acute osseous abnormality. 2. Chronic deformity of the second and third metacarpal bones the from previous fractures and K-wire fixation. Electronically Signed   By: Kerby Moors M.D.   On: 05/23/2019 14:32    Procedures Procedures (including critical care time)  Medications Ordered in ED Medications  traMADol (ULTRAM) tablet 50 mg (50 mg Oral Given 05/23/19 1348)  ibuprofen (ADVIL) tablet 800 mg (800 mg Oral Given 05/23/19 1348)     Initial Impression / Assessment and Plan / ED Course  I have reviewed the triage vital signs and the nursing notes.  Pertinent labs & imaging results that were available during my care of the patient were reviewed by me and considered in my medical decision making (see chart for details).        28 year old male right hand pain.  Punched a wall yesterday.  Having pain along the fourth and fifth MCP, no evidence of acute bony abnormality on x-rays today.  He is placed into a wrist brace.  He will rest ice and elevate the area.  Take ibuprofen as needed for pain.  Final Clinical Impressions(s) / ED Diagnoses   Final diagnoses:  Contusion of right hand, initial encounter    ED Discharge Orders         Ordered    ibuprofen (ADVIL) 600 MG tablet  Every 6 hours PRN     05/23/19 1519           Renata Caprice 05/23/19 1521    Vanessa Plymouth, MD 05/24/19 [phone]

## 2019-05-23 NOTE — ED Triage Notes (Signed)
Pt presents via POV c/o right hand pain. Reports punched wall last PM.

## 2019-05-23 NOTE — Discharge Instructions (Addendum)
Please wear wrist brace as needed for pain.  Apply ice to the hand over the next 2 to 3 days.  Take ibuprofen as needed for pain.  Follow-up with orthopedics if no improvement 1 week.

## 2019-05-23 NOTE — ED Notes (Signed)
See triage note. Pt in with injury to R hand after punching wall. R hand with visible deformity. Pulse 1+; hand warm; appropriate color; cap refill<3 sec. Pt can wiggle fingers. Hand/wrist painful to pt. Pt given ice pack and has it applied to site.

## 2019-06-08 ENCOUNTER — Encounter: Payer: Self-pay | Admitting: Emergency Medicine

## 2019-06-08 ENCOUNTER — Emergency Department
Admission: EM | Admit: 2019-06-08 | Discharge: 2019-06-08 | Disposition: A | Discharge status: Home / Self Care | Payer: Self-pay | Attending: Emergency Medicine | Admitting: Emergency Medicine

## 2019-06-08 ENCOUNTER — Other Ambulatory Visit: Payer: Self-pay

## 2019-06-08 DIAGNOSIS — R369 Urethral discharge, unspecified: Secondary | ICD-10-CM | POA: Insufficient documentation

## 2019-06-08 DIAGNOSIS — R300 Dysuria: Secondary | ICD-10-CM | POA: Insufficient documentation

## 2019-06-08 DIAGNOSIS — Z5321 Procedure and treatment not carried out due to patient leaving prior to being seen by health care provider: Secondary | ICD-10-CM | POA: Insufficient documentation

## 2019-06-08 DIAGNOSIS — Z113 Encounter for screening for infections with a predominantly sexual mode of transmission: Secondary | ICD-10-CM | POA: Insufficient documentation

## 2019-06-08 LAB — URINALYSIS, COMPLETE (UACMP) WITH MICROSCOPIC
Bacteria, UA: NONE SEEN
Bacteria, UA: NONE SEEN
Bilirubin Urine: NEGATIVE
Bilirubin Urine: NEGATIVE
Glucose, UA: NEGATIVE mg/dL
Glucose, UA: NEGATIVE mg/dL
Hgb urine dipstick: NEGATIVE
Hgb urine dipstick: NEGATIVE
Ketones, ur: 5 mg/dL — AB
Ketones, ur: NEGATIVE mg/dL
Nitrite: NEGATIVE
Nitrite: NEGATIVE
Protein, ur: 30 mg/dL — AB
Protein, ur: 30 mg/dL — AB
Specific Gravity, Urine: 1.028 (ref 1.005–1.030)
Specific Gravity, Urine: 1.029 (ref 1.005–1.030)
Squamous Epithelial / HPF: NONE SEEN (ref 0–5)
WBC, UA: >50 WBC/hpf — ABNORMAL HIGH (ref 0–5)
WBC, UA: >50 WBC/hpf — ABNORMAL HIGH (ref 0–5)
pH: 5 (ref 5.0–8.0)
pH: 6 (ref 5.0–8.0)

## 2019-06-08 NOTE — ED Triage Notes (Signed)
Pt comes via POV from home with c/o possible STD. Pt states discharge from Penis that he noticed yesterday.  Pt states pain with urination. Pt states white discharge.

## 2019-06-08 NOTE — ED Notes (Signed)
No answer when called several times from lobby 

## 2019-06-08 NOTE — ED Triage Notes (Signed)
Patient ambulatory to triage with steady gait, without difficulty or distress noted, mask in place; pt reports "I think I got a UTI"; st painful urination x 3 days; denies discharge or accomp symptoms

## 2019-06-08 NOTE — ED Notes (Signed)
See triage note  Presents with dysuria and some penile discharge   States started couple of days ago

## 2019-06-10 LAB — GC/CHLAMYDIA PROBE AMP
Chlamydia trachomatis, NAA: POSITIVE — AB
Neisseria Gonorrhoeae by PCR: POSITIVE — AB

## 2019-06-11 ENCOUNTER — Other Ambulatory Visit: Payer: Self-pay

## 2019-06-11 ENCOUNTER — Ambulatory Visit: Payer: Self-pay | Admitting: Physician Assistant

## 2019-06-11 DIAGNOSIS — A5401 Gonococcal cystitis and urethritis, unspecified: Secondary | ICD-10-CM

## 2019-06-11 DIAGNOSIS — Z113 Encounter for screening for infections with a predominantly sexual mode of transmission: Secondary | ICD-10-CM

## 2019-06-11 LAB — GRAM STAIN

## 2019-06-11 MED ORDER — CEFTRIAXONE SODIUM 250 MG IJ SOLR
250.0000 mg | Freq: Once | INTRAMUSCULAR | Status: AC
  Administered 2019-06-11: 12:00:00 250 mg via INTRAMUSCULAR

## 2019-06-11 MED ORDER — AZITHROMYCIN 500 MG PO TABS
1000.0000 mg | ORAL_TABLET | Freq: Once | ORAL | Status: AC
  Administered 2019-06-11: 12:00:00 1000 mg via ORAL

## 2019-06-11 NOTE — Progress Notes (Signed)
+  GC treated per standing order Debera Lat, RN

## 2019-06-12 ENCOUNTER — Encounter: Payer: Self-pay | Admitting: Physician Assistant

## 2019-06-12 NOTE — Progress Notes (Signed)
    STI clinic/screening visit  Subjective:  Michael Finley is a 28 y.o. male being seen today for an STI screening visit. The patient reports they do have symptoms.  Patient has the following medical conditions:   Patient Active Problem List   Diagnosis Date Noted  . Major depressive disorder, single episode, severe ([place]) 09/04/2016     Chief Complaint  Patient presents with  . SEXUALLY TRANSMITTED DISEASE    HPI  Patient reports that he has been having whitish discharge and dysuria for about 4 days.  Denies other symptoms.  See flowsheet for further details and programmatic requirements.    The following portions of the patient's history were reviewed and updated as appropriate: allergies, current medications, past medical history, past social history, past surgical history and problem list.  Objective:  There were no vitals filed for this visit.  Physical Exam Constitutional:      General: He is not in acute distress.    Appearance: Normal appearance.  HENT:     Head: Normocephalic and atraumatic.     Mouth/Throat:     Mouth: Mucous membranes are moist.     Pharynx: Oropharynx is clear. No oropharyngeal exudate or posterior oropharyngeal erythema.  Eyes:     Conjunctiva/sclera: Conjunctivae normal.  Neck:     Musculoskeletal: Neck supple.  Pulmonary:     Effort: Pulmonary effort is normal.  Abdominal:     Palpations: Abdomen is soft. There is no mass.     Tenderness: There is no abdominal tenderness. There is no guarding or rebound.  Genitourinary:    Penis: Normal.      Scrotum/Testes: Normal.     Comments: Pubic area without nits, lice, edema, erythema, lesions and inguinal adenopathy. Penis circumcised and with small amount of cloudy, whitish discharge from meatus. Lymphadenopathy:     Cervical: No cervical adenopathy.  Skin:    General: Skin is warm and dry.     Findings: No bruising, erythema, lesion or rash.  Neurological:     Mental Status: He is  alert and oriented to person, place, and time.  Psychiatric:        Mood and Affect: Mood normal.        Behavior: Behavior normal.        Thought Content: Thought content normal.        Judgment: Judgment normal.       Assessment and Plan:  Michael Finley is a 28 y.o. male presenting to the [facility] Department for STI screening  1. Screening for STD (sexually transmitted disease) Patient into clinic with symptoms.  Rec condoms with all sex. Await test results.  Counseled that RN will call if needs to RTC for further treatment once results are back.  - Gram stain - HIV [facility] - Syphilis Serology,  Lab  2. Gonococcal urethritis in male Treat for GC with Ceftriaxone 250mg  IM and Azithromycin 1 g po DOT today. No sex for 7 days and until after partner completes treatment. RTC if vomits < 2 hr after taking medicine for re-treatment. - azithromycin (ZITHROMAX) tablet 1,000 mg - cefTRIAXone (ROCEPHIN) injection 250 mg     No follow-ups on file.  No future appointments.  Jerene Dilling, PA

## 2020-06-15 ENCOUNTER — Encounter: Payer: Self-pay | Admitting: Intensive Care

## 2020-06-15 ENCOUNTER — Other Ambulatory Visit: Payer: Self-pay

## 2020-06-15 ENCOUNTER — Emergency Department
Admission: EM | Admit: 2020-06-15 | Discharge: 2020-06-15 | Disposition: A | Discharge status: Home / Self Care | Payer: Self-pay | Attending: Emergency Medicine | Admitting: Emergency Medicine

## 2020-06-15 DIAGNOSIS — F17200 Nicotine dependence, unspecified, uncomplicated: Secondary | ICD-10-CM | POA: Insufficient documentation

## 2020-06-15 DIAGNOSIS — W228XXA Striking against or struck by other objects, initial encounter: Secondary | ICD-10-CM | POA: Insufficient documentation

## 2020-06-15 DIAGNOSIS — S01312A Laceration without foreign body of left ear, initial encounter: Secondary | ICD-10-CM | POA: Insufficient documentation

## 2020-06-15 DIAGNOSIS — F1590 Other stimulant use, unspecified, uncomplicated: Secondary | ICD-10-CM | POA: Insufficient documentation

## 2020-06-15 MED ORDER — CEPHALEXIN 500 MG PO CAPS: 500.0000 mg | ORAL_CAPSULE | Freq: Three times a day (TID) | ORAL | 0 refills | Status: AC

## 2020-06-15 NOTE — Discharge Instructions (Addendum)
Take Keflex three times daily for the next seven days. Keep left ear clean and dry for the next twenty four hours.
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
## 2020-06-15 NOTE — ED Notes (Signed)
Patient states he is homeless and is in a bad living situation and wants to talk to someone about that.
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
## 2020-06-16 ENCOUNTER — Emergency Department
Admission: EM | Admit: 2020-06-16 | Discharge: 2020-06-18 | Disposition: A | Discharge status: Home / Self Care | Payer: HRSA Program | Attending: Emergency Medicine | Admitting: Emergency Medicine

## 2020-06-16 ENCOUNTER — Other Ambulatory Visit: Payer: Self-pay

## 2020-06-16 DIAGNOSIS — F314 Bipolar disorder, current episode depressed, severe, without psychotic features: Secondary | ICD-10-CM | POA: Diagnosis not present

## 2020-06-16 DIAGNOSIS — F17200 Nicotine dependence, unspecified, uncomplicated: Secondary | ICD-10-CM | POA: Insufficient documentation

## 2020-06-16 DIAGNOSIS — Z96622 Presence of left artificial elbow joint: Secondary | ICD-10-CM | POA: Insufficient documentation

## 2020-06-16 DIAGNOSIS — R45851 Suicidal ideations: Secondary | ICD-10-CM | POA: Insufficient documentation

## 2020-06-16 DIAGNOSIS — F4310 Post-traumatic stress disorder, unspecified: Secondary | ICD-10-CM | POA: Insufficient documentation

## 2020-06-16 DIAGNOSIS — Z96698 Presence of other orthopedic joint implants: Secondary | ICD-10-CM | POA: Diagnosis not present

## 2020-06-16 DIAGNOSIS — U071 COVID-19: Secondary | ICD-10-CM | POA: Diagnosis not present

## 2020-06-16 DIAGNOSIS — R451 Restlessness and agitation: Secondary | ICD-10-CM

## 2020-06-16 DIAGNOSIS — F919 Conduct disorder, unspecified: Secondary | ICD-10-CM | POA: Insufficient documentation

## 2020-06-16 DIAGNOSIS — F4321 Adjustment disorder with depressed mood: Secondary | ICD-10-CM | POA: Diagnosis present

## 2020-06-16 DIAGNOSIS — R4689 Other symptoms and signs involving appearance and behavior: Secondary | ICD-10-CM

## 2020-06-16 DIAGNOSIS — Z96621 Presence of right artificial elbow joint: Secondary | ICD-10-CM | POA: Diagnosis not present

## 2020-06-16 LAB — COMPREHENSIVE METABOLIC PANEL
ALT: 41 U/L (ref 0–44)
AST: 52 U/L — ABNORMAL HIGH (ref 15–41)
Albumin: 4.9 g/dL (ref 3.5–5.0)
Alkaline Phosphatase: 97 U/L (ref 38–126)
Anion gap: 11 (ref 5–15)
BUN: 11 mg/dL (ref 6–20)
CO2: 25 mmol/L (ref 22–32)
Calcium: 8.8 mg/dL — ABNORMAL LOW (ref 8.9–10.3)
Chloride: 102 mmol/L (ref 98–111)
Creatinine, Ser: 1.03 mg/dL (ref 0.61–1.24)
GFR, Estimated: >60 mL/min (ref >60)
Glucose, Bld: 95 mg/dL (ref 70–99)
Potassium: 3.9 mmol/L (ref 3.5–5.1)
Sodium: 138 mmol/L (ref 135–145)
Total Bilirubin: 0.8 mg/dL (ref 0.3–1.2)
Total Protein: 8.7 g/dL — ABNORMAL HIGH (ref 6.5–8.1)

## 2020-06-16 LAB — SALICYLATE LEVEL: Salicylate Lvl: <7 mg/dL — ABNORMAL LOW (ref 7.0–30.0)

## 2020-06-16 LAB — CBC
HCT: 50.8 % (ref 39.0–52.0)
Hemoglobin: 17.5 g/dL — ABNORMAL HIGH (ref 13.0–17.0)
MCH: 29.8 pg (ref 26.0–34.0)
MCHC: 34.4 g/dL (ref 30.0–36.0)
MCV: 86.4 fL (ref 80.0–100.0)
Platelets: 254 10*3/uL (ref 150–400)
RBC: 5.88 MIL/uL — ABNORMAL HIGH (ref 4.22–5.81)
RDW: 13.2 % (ref 11.5–15.5)
WBC: 8.9 10*3/uL (ref 4.0–10.5)
nRBC: 0 % (ref 0.0–0.2)

## 2020-06-16 LAB — ETHANOL: Alcohol, Ethyl (B): <10 mg/dL (ref <10)

## 2020-06-16 LAB — ACETAMINOPHEN LEVEL: Acetaminophen (Tylenol), Serum: <10 ug/mL — ABNORMAL LOW (ref 10–30)

## 2020-06-16 NOTE — ED Notes (Signed)
Pt dressed out with this RN and Museum/gallery curator Belongings include: Shoes Black Database administrator Black boxers

## 2020-06-16 NOTE — ED Triage Notes (Signed)
Pt to ED POV for SI/HI. States was trying to seek help at [facility], was referred to ED by [employer] d/t [facility] being closed.  Pt denies plan for SI/HI, states he has tried to kill himself "plenty of times before" States he is in a bad environment and "something is going to happen to me or something is going to happen to somebody else"  Pt endorses both SI and HI  Pt very calm in triage

## 2020-06-17 LAB — RESPIRATORY PANEL BY RT PCR (FLU A&B, COVID)
Influenza A by PCR: NEGATIVE
Influenza B by PCR: NEGATIVE
SARS Coronavirus 2 by RT PCR: POSITIVE — AB
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
## 2020-06-17 NOTE — ED Notes (Signed)
Patient is IVC pending placement 

## 2020-06-17 NOTE — ED Notes (Signed)
Pt report suicidal ideation due to "everything in his life." Pt says he does feel like hurting others but no one specifically, states he just feels angry. Pt denies plan for SI at this time. Pt is AOX4.

## 2020-06-17 NOTE — ED Notes (Signed)
Psych at bedside.

## 2020-06-17 NOTE — ED Provider Notes (Addendum)
[facility] Emergency Department Provider Note   ____________________________________________   I have reviewed the triage vital signs and the nursing notes.   HISTORY  Chief Complaint Unknown  History limited by: Not cooperative   HPI Michael Finley is a 29 y.o. male who presents to the emergency department today for concern for SI per nursing documentation. However on my exam patient refusing to answer questions. He was in hallway bed and I did offer to bring patient to more private room where we could talk however he declined. He continued to not answer my questions and started to become agitated.    Records reviewed. Per medical record review patient has a history of ER visit earlier today because of concern for left ear laceration.   Past Medical History:  Diagnosis Date  . Osteogenesis imperfecta   . PTSD (post-traumatic stress disorder)     Patient Active Problem List   Diagnosis Date Noted  . Major depressive disorder, single episode, severe (HCC) 09/04/2016    Past Surgical History:  Procedure Laterality Date  . ELBOW ARTHROPLASTY Bilateral   . HAND ARTHROPLASTY    . HIP ARTHROSCOPY    . MANDIBLE RECONSTRUCTION    . STOMACH SURGERY     to remove ulcers.    Prior to Admission medications   Medication Sig Start Date End Date Taking? Authorizing Provider  cephALEXin (KEFLEX) 500 MG capsule Take 1 capsule (500 mg total) by mouth 3 (three) times daily for 7 days. 06/15/20 06/22/20  Orvil Feil, PA-C  prazosin (MINIPRESS) 1 MG capsule Take 1 capsule (1 mg total) by mouth at bedtime. 07/28/18   Arnaldo Natal, MD  sertraline (ZOLOFT) 50 MG tablet Take 1 tablet (50 mg total) by mouth daily. 07/28/18 07/28/19  Arnaldo Natal, MD    Allergies Patient has no known allergies.  No family history on file.  Social History Social History   Tobacco Use  . Smoking status: Current Every Day Smoker    Packs/day: 0.50  . Smokeless tobacco:  Never Used  Substance Use Topics  . Alcohol use: Yes    Comment: occasional  . Drug use: Yes    Types: Marijuana    Review of Systems Unable to obtain secondary to not being cooperative with exam.  ____________________________________________   PHYSICAL EXAM: PE limited secondary to agitation and concern for safety VITAL SIGNS: ED Triage Vitals [06/16/20 2239]  Enc Vitals Group     BP 132/80     Pulse Rate 94     Resp 18     Temp 97.8 F (36.6 C)     Temp Source Oral     SpO2 98 %     Weight 215 lb (97.5 kg)     Height 5\' 9"  (1.753 m)     Head Circumference      Peak Flow      Pain Score 0   Constitutional: Alert and oriented.  Eyes: Conjunctivae are normal.  ENT      Head: Normocephalic and atraumatic.      Nose: No congestion/rhinnorhea.      Mouth/Throat: Mucous membranes are moist.      Neck: No stridor. Respiratory: Normal respiratory effort without tachypnea nor retractions. Musculoskeletal: Normal range of motion in all extremities.  Neurologic:  Normal speech and language.  Skin: No rash noted. Psychiatric: Agitated.  ____________________________________________    LABS (pertinent positives/negatives)  Acetaminophen, salicylate, ethanol below threshold CMP wnl except ca 8.8, t pro 8.7,  ast 52 CBC wbc 8.9, hgb 17.5, plt 254  ____________________________________________   EKG  None  ____________________________________________    RADIOLOGY  None  ____________________________________________   PROCEDURES  Procedures  ____________________________________________   INITIAL IMPRESSION / ASSESSMENT AND PLAN / ED COURSE  Pertinent labs & imaging results that were available during my care of the patient were reviewed by me and considered in my medical decision making (see chart for details).   Patient came to the emergency department today because of concern for SI per nursing documentation. However patient was not cooperative with my  exam. Given concern for possible psychiatric complaint will have psychiatry team evaluate.   Patient evaluated by psychiatry team. They feel patient does require inpatient admission. Was more forthcoming with psychiatric team. Did express suicidal ideation.   The patient has been placed in psychiatric observation due to the need to provide a safe environment for the patient while obtaining psychiatric consultation and evaluation, as well as ongoing medical and medication management to treat the patient's condition.  The patient has been placed under full IVC at this time.   ____________________________________________   FINAL CLINICAL IMPRESSION(S) / ED DIAGNOSES  Final diagnoses:  Agitation  Uncooperative behavior     Note: This dictation was prepared with Dragon dictation. Any transcriptional errors that result from this process are unintentional     Phineas Semen, MD 06/17/20 Everrett Coombe, MD 06/17/20 Margarito Courser    Phineas Semen, MD 06/17/20 0530

## 2020-06-18 DIAGNOSIS — F4321 Adjustment disorder with depressed mood: Secondary | ICD-10-CM | POA: Diagnosis present

## 2020-06-18 NOTE — ED Provider Notes (Signed)
-----------------------------------------   10:23 AM on 06/18/2020 -----------------------------------------  Patient has been seen and evaluated by psychiatry.  They believe the patient is safe for discharge home from a psychiatric standpoint.  They have rescinded the patient's IVC.  Patient's medical work-up shows he is Covid positive.  Provided isolation precaution information.  Patient will be discharged home.   Minna Antis, MD 06/18/20 1023

## 2020-06-18 NOTE — ED Notes (Signed)
Psych and TTS at bedside. 

## 2020-06-18 NOTE — ED Notes (Signed)
Pt calm and cooperative during assessment. Pt denies SI/H/AVH at this time. Denies further needs.

## 2020-06-18 NOTE — Consult Note (Signed)
[facility] Psych ED Discharge  06/18/2020 11:28 AM Michael Finley  MRN:  [id number] Principal Problem: Adjustment disorder with depressed mood Discharge Diagnoses: Principal Problem:   Adjustment disorder with depressed mood  Subjective: "I'm good."  Patient seen evaluated by this provider and TTS.  He reports he feels better after sleeping and wants "my papers and leave."  No suicidal/homicidal ideations, hallucinations, or substance use.  States he got upset prior to admission and is now calm and stable.  He feels safe discharging and seeking friends to stay with.  Offered him assistance with shelters and he declined.  No symptoms from COVID voiced.  Total Time spent with patient: 45 minutes  Past Psychiatric History: none  Past Medical History:  Past Medical History:  Diagnosis Date  . Osteogenesis imperfecta   . PTSD (post-traumatic stress disorder)     Past Surgical History:  Procedure Laterality Date  . ELBOW ARTHROPLASTY Bilateral   . HAND ARTHROPLASTY    . HIP ARTHROSCOPY    . MANDIBLE RECONSTRUCTION    . STOMACH SURGERY     to remove ulcers.   Family History: No family history on file. Family Psychiatric  History: none Social History:  Social History   Substance and Sexual Activity  Alcohol Use Yes   Comment: occasional     Social History   Substance and Sexual Activity  Drug Use Yes  . Types: Marijuana    Social History   Socioeconomic History  . Marital status: Divorced    Spouse name: Not on file  . Number of children: Not on file  . Years of education: Not on file  . Highest education level: Not on file  Occupational History  . Not on file  Tobacco Use  . Smoking status: Current Every Day Smoker    Packs/day: 0.50  . Smokeless tobacco: Never Used  Substance and Sexual Activity  . Alcohol use: Yes    Comment: occasional  . Drug use: Yes    Types: Marijuana  . Sexual activity: Yes    Birth control/protection: Condom  Other Topics Concern  . Not  on file  Social History Narrative  . Not on file   Social Determinants of Health   Financial Resource Strain:   . Difficulty of Paying Living Expenses: Not on file  Food Insecurity:   . Worried About Programme researcher, broadcasting/film/video in the Last Year: Not on file  . Ran Out of Food in the Last Year: Not on file  Transportation Needs:   . Lack of Transportation (Medical): Not on file  . Lack of Transportation (Non-Medical): Not on file  Physical Activity:   . Days of Exercise per Week: Not on file  . Minutes of Exercise per Session: Not on file  Stress:   . Feeling of Stress : Not on file  Social Connections:   . Frequency of Communication with Friends and Family: Not on file  . Frequency of Social Gatherings with Friends and Family: Not on file  . Attends Religious Services: Not on file  . Active Member of Clubs or Organizations: Not on file  . Attends Banker Meetings: Not on file  . Marital Status: Not on file    Has this patient used any form of tobacco in the last 30 days? (Cigarettes, Smokeless Tobacco, Cigars, and/or Pipes) A prescription for an FDA-approved tobacco cessation medication was offered at discharge and the patient refused  Current Medications: No current facility-administered medications for this encounter.  Current Outpatient Medications  Medication Sig Dispense Refill  . cephALEXin (KEFLEX) 500 MG capsule Take 1 capsule (500 mg total) by mouth 3 (three) times daily for 7 days. 21 capsule 0   PTA Medications: (Not in a hospital admission)   Musculoskeletal: Strength & Muscle Tone: within normal limits Gait & Station: normal Patient leans: N/A  Psychiatric Specialty Exam: Physical Exam Vitals and nursing note reviewed.  Constitutional:      Appearance: Normal appearance.  HENT:     Head: Normocephalic.     Nose: Nose normal.  Pulmonary:     Effort: Pulmonary effort is normal.  Musculoskeletal:        General: Swelling present.     Cervical  back: Normal range of motion.  Neurological:     General: No focal deficit present.     Mental Status: He is alert and oriented to person, place, and time.  Psychiatric:        Attention and Perception: Attention and perception normal.        Mood and Affect: Mood and affect normal.        Speech: Speech normal.        Behavior: Behavior normal. Behavior is cooperative.        Thought Content: Thought content normal.        Cognition and Memory: Cognition and memory normal.        Judgment: Judgment normal.     Review of Systems  All other systems reviewed and are negative.   Blood pressure 116/72, pulse (!) 57, temperature 98.4 F (36.9 C), temperature source Oral, resp. rate 18, height 5\' 9"  (1.753 m), weight 97.5 kg, SpO2 98 %.Body mass index is 31.75 kg/m.  General Appearance: Casual  Eye Contact:  Fair  Speech:  Normal Rate  Volume:  Normal  Mood:  Irritable at times  Affect:  Congruent  Thought Process:  Coherent and Descriptions of Associations: Intact  Orientation:  Full (Time, Place, and Person)  Thought Content:  WDL and Logical  Suicidal Thoughts:  No  Homicidal Thoughts:  No  Memory:  Immediate;   Good Recent;   Good Remote;   Good  Judgement:  Fair  Insight:  Fair  Psychomotor Activity:  Normal  Concentration:  Concentration: Good and Attention Span: Good  Recall:  Good  Fund of Knowledge:  Good  Language:  Good  Akathisia:  No  Handed:  Right  AIMS (if indicated):     Assets:  Leisure Time Physical Health Resilience Social Support  ADL's:  Intact  Cognition:  WNL  Sleep:        Demographic Factors:  Male  Loss Factors: NA  Historical Factors: NA  Risk Reduction Factors:   Sense of responsibility to family, Living with another person, especially a relative and Positive social support  Continued Clinical Symptoms:  Irritable when first awakened  Cognitive Features That Contribute To Risk:  None    Suicide Risk:  Minimal: No  identifiable suicidal ideation.  Patients presenting with no risk factors but with morbid ruminations; may be classified as minimal risk based on the severity of the depressive symptoms    Plan Of Care/Follow-up recommendations:  Adjustment disorder with depressed mood: -Follow up with [facility], resources in discharge instructions Activity:  as tolerated Diet:  heart healthy diet  Disposition: discharge home , NP 06/18/2020, 11:28 AM

## 2020-06-18 NOTE — ED Provider Notes (Signed)
Emergency Medicine Observation Re-evaluation Note  Michael Finley is a 29 y.o. male, seen on rounds today.  Pt initially presented to the ED for complaints of voluntary Currently, the patient is resting, voices no medical complaints.  Physical Exam  BP 132/78   Pulse 74   Temp 98.7 F (37.1 C) (Oral)   Resp 16   Ht 5\' 9"  (1.753 m)   Wt 97.5 kg   SpO2 94%   BMI 31.75 kg/m  Physical Exam General: Resting in no acute distress Cardiac: No cyanosis Lungs: Equal rise and fall Psych: Agitated  ED Course / MDM  EKG:    I have reviewed the labs performed to date as well as medications administered while in observation.  Recent changes in the last 24 hours include no changes overnight.  Plan  Current plan is for psychiatric disposition. Patient is under full IVC at this time.   , MD 06/18/20 [phone]

## 2020-06-18 NOTE — ED Notes (Signed)
Pt given meal tray.

## 2020-06-18 NOTE — Discharge Instructions (Addendum)
You have been seen in the emergency department for a  psychiatric concern. You have been evaluated both medically as well as psychiatrically. Please follow-up with your outpatient resources provided. Return to the emergency department for any worsening symptoms, or any thoughts of hurting yourself or anyone else so that we may attempt to help you.  Your Covid test has resulted positive.  Please isolate until 06/27/2020.  Return to the emergency department for any significant shortness of breath, or any other symptom personally concerning to yourself.  [facility] [employer] [employer] ([facility]stance Use [facility]) & [facility]  [place] in [place] Address: [address] [place], [place] [address] Hours:  Closed ? Oneita Jolly Phone: [phone]

## 2021-03-05 ENCOUNTER — Emergency Department
Admission: EM | Admit: 2021-03-05 | Discharge: 2021-03-06 | Discharge status: Against Medical Advice | Payer: Self-pay | Attending: Emergency Medicine | Admitting: Emergency Medicine

## 2021-03-05 ENCOUNTER — Other Ambulatory Visit: Payer: Self-pay

## 2021-03-05 DIAGNOSIS — Z96622 Presence of left artificial elbow joint: Secondary | ICD-10-CM | POA: Insufficient documentation

## 2021-03-05 DIAGNOSIS — H209 Unspecified iridocyclitis: Secondary | ICD-10-CM | POA: Insufficient documentation

## 2021-03-05 DIAGNOSIS — S0591XA Unspecified injury of right eye and orbit, initial encounter: Secondary | ICD-10-CM | POA: Insufficient documentation

## 2021-03-05 DIAGNOSIS — X58XXXA Exposure to other specified factors, initial encounter: Secondary | ICD-10-CM | POA: Insufficient documentation

## 2021-03-05 DIAGNOSIS — Z96621 Presence of right artificial elbow joint: Secondary | ICD-10-CM | POA: Insufficient documentation

## 2021-03-05 DIAGNOSIS — F17210 Nicotine dependence, cigarettes, uncomplicated: Secondary | ICD-10-CM | POA: Insufficient documentation

## 2021-03-05 MED ORDER — TETRACAINE HCL 0.5 % OP SOLN
2.0000 [drp] | Freq: Once | OPHTHALMIC | Status: AC
  Administered 2021-03-06: 2 [drp] via OPHTHALMIC
  Filled 2021-03-05: qty 4

## 2021-03-05 MED ORDER — FLUORESCEIN SODIUM 1 MG OP STRP
1.0000 | ORAL_STRIP | Freq: Once | OPHTHALMIC | Status: AC
  Administered 2021-03-06: 1 via OPHTHALMIC
  Filled 2021-03-05: qty 1
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
## 2021-03-05 NOTE — ED Notes (Signed)
When pt did visual acuity screening pt states eh cannot even see the largest letter at the top of the chart. Pt states "I cannot see anything" when pt asked if vision was completely black or blurry he said "completley blurry"
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
## 2021-03-06 NOTE — ED Notes (Signed)
Patient seen leaving department via triage Patient did not have PIV in place Patient did not return to room Charge nurse and MD aware of situation
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
## 2021-03-09 ENCOUNTER — Emergency Department: Payer: Medicaid Other

## 2021-03-09 ENCOUNTER — Other Ambulatory Visit: Payer: Self-pay

## 2021-03-09 ENCOUNTER — Encounter: Payer: Self-pay | Admitting: Emergency Medicine

## 2021-03-09 DIAGNOSIS — Z5321 Procedure and treatment not carried out due to patient leaving prior to being seen by health care provider: Secondary | ICD-10-CM | POA: Insufficient documentation

## 2021-03-09 DIAGNOSIS — S59912A Unspecified injury of left forearm, initial encounter: Secondary | ICD-10-CM | POA: Insufficient documentation

## 2021-03-09 DIAGNOSIS — X58XXXA Exposure to other specified factors, initial encounter: Secondary | ICD-10-CM | POA: Insufficient documentation

## 2021-03-09 MED ORDER — HYDROCODONE-ACETAMINOPHEN 5-325 MG PO TABS
1.0000 | ORAL_TABLET | Freq: Once | ORAL | Status: AC
  Administered 2021-03-09: 1 via ORAL
  Filled 2021-03-09: qty 1

## 2021-03-09 NOTE — ED Triage Notes (Signed)
Pt to ED c/o left arm injury, states pain to left forearm, no obvious deformity noted, skin WNL.  Pt irritated in triage, getting mad when asked questions and not forthcoming with information.  States hx of osteogenesis imperfecta.

## 2021-03-10 ENCOUNTER — Encounter: Payer: Self-pay | Admitting: Emergency Medicine

## 2021-03-10 ENCOUNTER — Emergency Department
Admission: EM | Admit: 2021-03-10 | Discharge: 2021-03-10 | Disposition: A | Discharge status: Home / Self Care | Payer: Medicaid Other | Attending: Emergency Medicine | Admitting: Emergency Medicine

## 2021-03-10 ENCOUNTER — Other Ambulatory Visit: Payer: Self-pay

## 2021-03-10 DIAGNOSIS — R600 Localized edema: Secondary | ICD-10-CM | POA: Insufficient documentation

## 2021-03-10 DIAGNOSIS — X58XXXA Exposure to other specified factors, initial encounter: Secondary | ICD-10-CM | POA: Insufficient documentation

## 2021-03-10 DIAGNOSIS — F17210 Nicotine dependence, cigarettes, uncomplicated: Secondary | ICD-10-CM | POA: Insufficient documentation

## 2021-03-10 DIAGNOSIS — S5292XA Unspecified fracture of left forearm, initial encounter for closed fracture: Secondary | ICD-10-CM

## 2021-03-10 MED ORDER — OXYCODONE-ACETAMINOPHEN 7.5-325 MG PO TABS: 1.0000 | ORAL_TABLET | Freq: Four times a day (QID) | ORAL | 0 refills | Status: DC | PRN

## 2021-03-10 NOTE — ED Notes (Addendum)
Left wrist splinted with orthoglass in short arm splint. CMS intact. Pt refuses sling.

## 2021-03-10 NOTE — ED Triage Notes (Signed)
Pt reports pain to left arm. Pt reports he came last night and got an x-ray but the wait was too long so he left.

## 2021-03-10 NOTE — Discharge Instructions (Addendum)
Wear splint and arm sling until evaluation by orthopedics.  Call Monday morning to schedule appointment.  Tell them you are follow-up from the emergency room.  Take medication as directed.

## 2021-03-10 NOTE — ED Provider Notes (Signed)
[facility] Emergency Department Provider Note   ____________________________________________   Event Date/Time   First MD Initiated Contact with Patient 03/10/21 1610     (approximate)  I have reviewed the triage vital signs and the nursing notes.   HISTORY  Chief Complaint Arm Injury    HPI Michael Finley is a 30 y.o. male patient complain left arm pain with no provoking incident.  Patient has a history of osteogenesis imperfecta.  Patient internal fixation left forearm placed about 10 years ago.  Denies loss sensation or loss of function.  Rates pain as a 10/10.  Described pain as "achy".  No palliative measure for complaint.         Past Medical History:  Diagnosis Date   Osteogenesis imperfecta    PTSD (post-traumatic stress disorder)     Patient Active Problem List   Diagnosis Date Noted   Adjustment disorder with depressed mood 06/18/2020    Past Surgical History:  Procedure Laterality Date   ELBOW ARTHROPLASTY Bilateral    HAND ARTHROPLASTY     HIP ARTHROSCOPY     MANDIBLE RECONSTRUCTION     STOMACH SURGERY     to remove ulcers.    Prior to Admission medications   Medication Sig Start Date End Date Taking? Authorizing Provider  oxyCODONE-acetaminophen (PERCOCET) 7.5-325 MG tablet Take 1 tablet by mouth every 6 (six) hours as needed for severe pain. 03/10/21  Yes Joni Reining, PA-C    Allergies Patient has no known allergies.  No family history on file.  Social History Social History   Tobacco Use   Smoking status: Every Day    Packs/day: 0.50    Types: Cigarettes   Smokeless tobacco: Never  Substance Use Topics   Alcohol use: Yes    Comment: occasional   Drug use: Yes    Types: Marijuana    Review of Systems  Constitutional: No fever/chills Eyes: No visual changes. ENT: No sore throat. Cardiovascular: Denies chest pain. Respiratory: Denies shortness of breath. Gastrointestinal: No abdominal pain.  No  nausea, no vomiting.  No diarrhea.  No constipation. Genitourinary: Negative for dysuria. Musculoskeletal: Left forearm pain. Skin: Negative for rash. Neurological: Negative for headaches, focal weakness or numbness. Psychiatric: PTSD. ____________________________________________   PHYSICAL EXAM:  VITAL SIGNS: ED Triage Vitals [03/10/21 1601]  Enc Vitals Group     BP      Pulse      Resp      Temp      Temp src      SpO2      Weight 198 lb 6.6 oz (90 kg)     Height 5\' 9"  (1.753 m)     Head Circumference      Peak Flow      Pain Score 10     Pain Loc      Pain Edu?      Excl. in GC?     Constitutional: Alert and oriented. Well appearing and in no acute distress. Cardiovascular: Normal rate, regular rhythm. Grossly normal heart sounds.  Good peripheral circulation. Respiratory: Normal respiratory effort.  No retractions. Lungs CTAB. Musculoskeletal: No obvious deformity to the left forearm.  Mild edema is appreciated.  Patient has full and equal range of motion.   Neurologic:  Normal speech and language. No gross focal neurologic deficits are appreciated. No gait instability. Skin:  Skin is warm, dry and intact. No rash noted.  Surgical scars consistent with past history. Psychiatric: Mood and  affect are normal. Speech and behavior are normal.  ____________________________________________   LABS (all labs ordered are listed, but only abnormal results are displayed)  Labs Reviewed - No data to display ____________________________________________  EKG   ____________________________________________  RADIOLOGY I, Joni Reining, personally viewed and evaluated these images (plain radiographs) as part of my medical decision making, as well as reviewing the written report by the radiologist.  ED MD interpretation:    Official radiology report(s): DG Forearm Left  Result Date: 03/10/2021 CLINICAL DATA:  Left arm pain/injury EXAM: LEFT FOREARM - 2 VIEW COMPARISON:   08/06/2010 FINDINGS: Compression plate and screw fixation of the distal radius and ulna. There is cortical lucency/fracture of the mid ulnar shaft adjacent to the most proximal interlocking screw along the compression plate. Very mild soft tissue swelling is suspected. IMPRESSION: Prior ORIF of the distal radius and ulna. Suspected fracture of the mid ulnar shaft adjacent to the proximal aspect of the compression plate. Electronically Signed   By: Charline Bills M.D.   On: 03/10/2021 00:38    ____________________________________________   PROCEDURES  Procedure(s) performed (including Critical Care):  Procedures   ____________________________________________   INITIAL IMPRESSION / ASSESSMENT AND PLAN / ED COURSE  As part of my medical decision making, I reviewed the following data within the electronic MEDICAL RECORD NUMBER         Patient presents with nontraumatic left forearm pain.  Patient x-ray is consistent with a nondisplaced fracture of the ulna adjacent to the proximal aspect of internal fixation.  Patient placed in a splint and sling.  Patient given discharge care instruction advised follow orthopedic by calling for an appointment in 2 days.      ____________________________________________   FINAL CLINICAL IMPRESSION(S) / ED DIAGNOSES  Final diagnoses:  Left forearm fracture, closed, initial encounter     ED Discharge Orders          Ordered    oxyCODONE-acetaminophen (PERCOCET) 7.5-325 MG tablet  Every 6 hours PRN        03/10/21 1628             Note:  This document was prepared using Dragon voice recognition software and may include unintentional dictation errors.    Joni Reining, PA-C 03/10/21 1638    [place], MD 03/11/21 [phone]

## 2021-03-10 NOTE — ED Notes (Signed)
Sling provided to pt and pt educated on use for elevation. Pt verbalizes understanding.

## 2021-09-10 ENCOUNTER — Other Ambulatory Visit: Payer: Self-pay

## 2021-09-10 ENCOUNTER — Emergency Department
Admission: EM | Admit: 2021-09-10 | Discharge: 2021-09-10 | Discharge status: Against Medical Advice | Payer: Medicaid Other | Attending: Emergency Medicine | Admitting: Emergency Medicine

## 2021-09-10 ENCOUNTER — Encounter: Payer: Self-pay | Admitting: Emergency Medicine

## 2021-09-10 ENCOUNTER — Emergency Department: Payer: Medicaid Other

## 2021-09-10 DIAGNOSIS — S60511A Abrasion of right hand, initial encounter: Secondary | ICD-10-CM | POA: Insufficient documentation

## 2021-09-10 DIAGNOSIS — Z5321 Procedure and treatment not carried out due to patient leaving prior to being seen by health care provider: Secondary | ICD-10-CM | POA: Insufficient documentation

## 2021-09-10 DIAGNOSIS — Y990 Civilian activity done for income or pay: Secondary | ICD-10-CM | POA: Insufficient documentation

## 2021-09-10 DIAGNOSIS — W228XXA Striking against or struck by other objects, initial encounter: Secondary | ICD-10-CM | POA: Insufficient documentation

## 2021-09-10 NOTE — ED Triage Notes (Signed)
Patient ambulatory to triage with steady gait, without difficulty or distress noted; pt reports "slamming" rt hand against a pole while at work (employed with [employer]); abrasion noted to top of hand; workers comp profile indicates UDS required; pt does not have his I/d with him at this time (pt is accomp by supervisor [employer] [phone]) who identifies pt as listed  here

## 2021-09-10 NOTE — ED Notes (Signed)
Patient refused Drug and/ or alcohol screening for workers comp, stated he do not think his hand is broke and he called a ride and is leaving.

## 2021-11-19 ENCOUNTER — Other Ambulatory Visit: Payer: Self-pay

## 2021-11-19 ENCOUNTER — Encounter: Payer: Self-pay | Admitting: Radiology

## 2021-11-19 ENCOUNTER — Emergency Department: Payer: Medicaid Other

## 2021-11-19 ENCOUNTER — Emergency Department
Admission: EM | Admit: 2021-11-19 | Discharge: 2021-11-19 | Disposition: A | Discharge status: Home / Self Care | Payer: Medicaid Other | Attending: Emergency Medicine | Admitting: Emergency Medicine

## 2021-11-19 DIAGNOSIS — S52251A Displaced comminuted fracture of shaft of ulna, right arm, initial encounter for closed fracture: Secondary | ICD-10-CM

## 2021-11-19 DIAGNOSIS — W010XXA Fall on same level from slipping, tripping and stumbling without subsequent striking against object, initial encounter: Secondary | ICD-10-CM | POA: Insufficient documentation

## 2021-11-19 DIAGNOSIS — S52611A Displaced fracture of right ulna styloid process, initial encounter for closed fracture: Secondary | ICD-10-CM | POA: Insufficient documentation

## 2021-11-19 DIAGNOSIS — M79631 Pain in right forearm: Secondary | ICD-10-CM | POA: Insufficient documentation

## 2021-11-19 DIAGNOSIS — Y9301 Activity, walking, marching and hiking: Secondary | ICD-10-CM | POA: Insufficient documentation

## 2021-11-19 DIAGNOSIS — S52614A Nondisplaced fracture of right ulna styloid process, initial encounter for closed fracture: Secondary | ICD-10-CM

## 2021-11-19 DIAGNOSIS — S52201A Unspecified fracture of shaft of right ulna, initial encounter for closed fracture: Secondary | ICD-10-CM | POA: Insufficient documentation

## 2021-11-19 DIAGNOSIS — W19XXXA Unspecified fall, initial encounter: Secondary | ICD-10-CM

## 2021-11-19 MED ORDER — ACETAMINOPHEN 500 MG PO TABS
1000.0000 mg | ORAL_TABLET | Freq: Once | ORAL | Status: AC
  Administered 2021-11-19: 1000 mg via ORAL
  Filled 2021-11-19: qty 2

## 2021-11-19 MED ORDER — OXYCODONE-ACETAMINOPHEN 5-325 MG PO TABS: 1.0000 | ORAL_TABLET | ORAL | 0 refills | Status: DC | PRN

## 2021-11-19 MED ORDER — OXYCODONE HCL 5 MG PO TABS
5.0000 mg | ORAL_TABLET | Freq: Once | ORAL | Status: AC
  Administered 2021-11-19: 5 mg via ORAL
  Filled 2021-11-19: qty 1

## 2021-11-19 NOTE — ED Triage Notes (Addendum)
Pt states he fell injuring right forearm. Possible deformity noted. Pt states has a history of osteogenesis. Pt not answering all questions in triage, alternating between walking off and lying on floor. Not able to obtain vital signs at this time due to pt behavior.  ?

## 2021-11-19 NOTE — ED Provider Notes (Addendum)
? ?[facility] ?Provider Note ? ? ? Event Date/Time  ? First MD Initiated Contact with Patient 11/19/21 0303   ?  (approximate) ? ? ?History  ? ?Arm Injury ? ? ?HPI ? ?Michael Finley is a 31 y.o. male with a history of osteogenesis imperfecta who presents for evaluation of right forearm pain.  Patient reports that he was walking to his car to get a charger when he tripped and fell onto his arm.  He is complaining of severe pain that is worse with any movement of the arm. The pain is located on the shaft of the forearm.  He denies wrist pain, hand pain, elbow pain, upper arm pain or shoulder pain.  No head trauma or LOC ?  ? ? ?Past Medical History:  ?Diagnosis Date  ? Osteogenesis imperfecta   ? PTSD (post-traumatic stress disorder)   ? ? ?Past Surgical History:  ?Procedure Laterality Date  ? ELBOW ARTHROPLASTY Bilateral   ? HAND ARTHROPLASTY    ? HIP ARTHROSCOPY    ? MANDIBLE RECONSTRUCTION    ? STOMACH SURGERY    ? to remove ulcers.  ? ? ? ?Physical Exam  ? ?Triage Vital Signs: ?ED Triage Vitals  ?Enc Vitals Group  ?   BP 11/19/21 0315 130/89  ?   Pulse Rate 11/19/21 0322 70  ?   Resp 11/19/21 0306 16  ?   Temp 11/19/21 0322 98 ?F (36.7 ?C)  ?   Temp Source 11/19/21 0322 Oral  ?   SpO2 11/19/21 0322 96 %  ?   Weight 11/19/21 0257 180 lb (81.6 kg)  ?   Height 11/19/21 0257 5\' 8"  (1.727 m)  ?   Head Circumference --   ?   Peak Flow --   ?   Pain Score 11/19/21 0258 10  ?   Pain Loc --   ?   Pain Edu? --   ?   Excl. in GC? --   ? ? ?Most recent vital signs: ?Vitals:  ? 11/19/21 0322 11/19/21 0330  ?BP:  127/84  ?Pulse:  74  ?Resp:  18  ?Temp: 98 ?F (36.7 ?C)   ?SpO2:  97%  ? ? ? ?Constitutional: Alert and oriented. Well appearing and in no apparent distress. ?HEENT: ?     Head: Normocephalic and atraumatic.    ?     Eyes: Conjunctivae are normal. Sclera is non-icteric.  ?     Mouth/Throat: Mucous membranes are moist.  ?     Neck: No midline C-spine tenderness ?Cardiovascular: Regular rate and  rhythm.  ?Respiratory: Normal respiratory effort.  ?Gastrointestinal: Soft, non tender. ?Musculoskeletal: Patient has obvious closed deformity of the right forearm with intact strong distal pulse, brisk capillary refill, and neurologically intact.  Palpation of the shoulder, upper arm, elbow and hand with no tenderness ?Neurologic: Normal speech and language. Face is symmetric. Moving all extremities. No gross focal neurologic deficits are appreciated. ?Skin: Skin is warm, dry and intact. No rash noted. ?Psychiatric: Mood and affect are normal. Speech and behavior are normal. ? ?ED Results / Procedures / Treatments  ? ?Labs ?(all labs ordered are listed, but only abnormal results are displayed) ?Labs Reviewed - No data to display ? ? ?EKG ? ?none ? ? ?RADIOLOGY ?I, Nita Sicklearolina Eagle Pitta, attending MD, have personally viewed and interpreted the images obtained during this visit as below: ? ?X-ray showing midshaft fracture of the ulna and styloid fracture ? ? ?___________________________________________________ ?Interpretation by Radiologist:  ?  DG Forearm Right ? ?Result Date: 11/19/2021 ?CLINICAL DATA:  Recent fall with forearm pain, initial encounter EXAM: RIGHT FOREARM - 2 VIEW COMPARISON:  None. FINDINGS: Comminuted fractures are noted within the midshaft of the ulna. Ulnar styloid fracture is noted as well. No definitive radial fracture is seen. Degenerative changes about the elbow joint noted. IMPRESSION: Multiple ulnar fractures as described. Electronically Signed   By: Alcide Clever M.D.   On: 11/19/2021 03:34   ? ? ? ?PROCEDURES: ? ?Critical Care performed: No ? ?.Ortho Injury Treatment ? ?Date/Time: 11/19/2021 4:50 AM ?Performed by: Nita Sickle, MD ?Authorized by: Nita Sickle, MD  ? ?Consent:  ?  Consent obtained:  Verbal ?  Consent given by:  Patient ?  Risks discussed:  Stiffness, nerve damage and restricted joint movement ?  Alternatives discussed:  ImmobilizationInjury location: forearm ?Location  details: right forearm ?Injury type: fracture ?Fracture type: ulnar shaft ?Pre-procedure neurovascular assessment: neurovascularly intact ?Pre-procedure distal perfusion: normal ?Pre-procedure neurological function: normal ?Pre-procedure range of motion: normal ? ?Anesthesia: ?Local anesthesia used: no ? ?Patient sedated: NoManipulation performed: no ?Immobilization: splint and sling ?Splint type: sugar tong ?Splint Applied by: ED Provider ?Supplies used: Ortho-Glass, elastic bandage and cotton padding ?Post-procedure neurovascular assessment: post-procedure neurovascularly intact ?Post-procedure distal perfusion: normal ?Post-procedure neurological function: normal ?Post-procedure range of motion: normal ? ? ? ? ? ?IMPRESSION / MDM / ASSESSMENT AND PLAN / ED COURSE  ?I reviewed the triage vital signs and the nursing notes. ? ?31 y.o. male with a history of osteogenesis imperfecta who presents for evaluation of right forearm pain status post mechanical fall.  On exam patient has closed deformity of the right forearm with no other joint or bone involvement of the upper extremity.  Neurovascularly intact x-ray visualized by me confirming a ulnar shaft and styloid fractures.  Arm was splinted per procedure note above.  Recommended follow-up with Ortho in 2 days.  Prescription for Percocet given.  Discussed my standard return precautions for any signs of compartment syndrome. Patient given sling ? ? ?MEDICATIONS GIVEN IN ED: ?Medications  ?acetaminophen (TYLENOL) tablet 1,000 mg (1,000 mg Oral Given 11/19/21 0334)  ?oxyCODONE (Oxy IR/ROXICODONE) immediate release tablet 5 mg (5 mg Oral Given 11/19/21 0334)  ? ? ? ?EMR reviewed including patient's last visit with orthopedics from 2020 for a scapula fracture ? ? ? ?FINAL CLINICAL IMPRESSION(S) / ED DIAGNOSES  ? ?Final diagnoses:  ?Closed displaced comminuted fracture of shaft of right ulna, initial encounter  ?Fall, initial encounter  ?Nondisplaced fracture of right ulna  styloid process, initial encounter for closed fracture  ? ? ? ?Rx / DC Orders  ? ?ED Discharge Orders   ? ?      Ordered  ?  Ambulatory referral to Orthopedic Surgery       ?Comments: H/o osteogenesis imperfecta. Acute right mid shaft ulna fracture  ? 11/19/21 0446  ?  oxyCODONE-acetaminophen (PERCOCET) 5-325 MG tablet  Every 4 hours PRN       ? 11/19/21 0446  ? ?  ?  ? ?  ? ? ? ?Note:  This document was prepared using Dragon voice recognition software and may include unintentional dictation errors. ? ? ?Please note:  Patient was evaluated in Emergency Department today for the symptoms described in the history of present illness. Patient was evaluated in the context of the global COVID-19 pandemic, which necessitated consideration that the patient might be at risk for infection with the SARS-CoV-2 virus that causes COVID-19. Institutional protocols and algorithms that  pertain to the evaluation of patients at risk for COVID-19 are in a state of rapid change based on information released by regulatory bodies including the CDC and federal and state organizations. These policies and algorithms were followed during the patient's care in the ED.  Some ED evaluations and interventions may be delayed as a result of limited staffing during the pandemic. ? ? ? ? ?  ?Nita Sickle, MD ?11/19/21 [phone] ? ?  ?Nita Sickle, MD ?11/19/21 [phone] ? ?

## 2021-11-19 NOTE — Discharge Instructions (Signed)
Elevate, apply ice. Keep splint dry and in place until cleared by Orthopedics.Follow up with Orthopedics in 2 days. Take pain medication as follows: ? ?Percocet as prescribed ? ?If your pain becomes severe, if your fingers become blue/purple or pale, or if you experience pins-and-needles sensation in your fingers and hand, remove the elastic wrap and reapply it looser.  Do not change the hard part of the splint.  If that resolves your symptoms it is okay to stay home and follow-up with orthopedics.  If you continue to have those symptoms please return to the emergency room immediately. ? ?

## 2023-02-05 ENCOUNTER — Encounter: Payer: Self-pay | Admitting: Family Medicine

## 2023-02-05 ENCOUNTER — Ambulatory Visit: Payer: Self-pay | Admitting: Family Medicine

## 2023-02-05 DIAGNOSIS — Z113 Encounter for screening for infections with a predominantly sexual mode of transmission: Secondary | ICD-10-CM

## 2023-02-05 DIAGNOSIS — Z202 Contact with and (suspected) exposure to infections with a predominantly sexual mode of transmission: Secondary | ICD-10-CM

## 2023-02-05 LAB — HM HEPATITIS C SCREENING LAB: HM Hepatitis Screen: NEGATIVE

## 2023-02-05 LAB — HM HIV SCREENING LAB: HM HIV Screening: NEGATIVE

## 2023-02-05 LAB — HEPATITIS B SURFACE ANTIGEN

## 2023-02-05 MED ORDER — CEFTRIAXONE SODIUM 500 MG IJ SOLR
500.0000 mg | Freq: Once | INTRAMUSCULAR | Status: AC
  Administered 2023-02-05: 500 mg via INTRAMUSCULAR

## 2023-02-05 NOTE — Progress Notes (Signed)
[facility] Department STI clinic/screening visit  Subjective:  Michael Finley is a 32 y.o. male being seen today for an STI screening visit. The patient reports they do have symptoms.    Patient has the following medical conditions:   Patient Active Problem List   Diagnosis Date Noted   Adjustment disorder with depressed mood 06/18/2020     Chief Complaint  Patient presents with   SEXUALLY TRANSMITTED DISEASE    STD screening    HPI  Patient reports  to clinic as a walk in with report of sore on his penis. He states the woman he was sleeping with called today and told him she has gonorrhea. He last had sex about 2 weeks ago. 2 partners in the last 2 months. Reports no pain from sore until he took a hot shower today.   Last HIV test per patient/review of record was No results found for: "HMHIVSCREEN" No results found for: "HIV"  Does the patient or their partner desires a pregnancy in the next year? No  Screening for MPX risk: Does the patient have an unexplained rash? No Is the patient MSM? No Does the patient endorse multiple sex partners or anonymous sex partners? No Did the patient have close or sexual contact with a person diagnosed with MPX? No Has the patient traveled outside the [place] where MPX is endemic? No Is there a high clinical suspicion for MPX-- evidenced by one of the following No  -Unlikely to be chickenpox  -Lymphadenopathy  -Rash that present in same phase of evolution on any given body part   See flowsheet for further details and programmatic requirements.   Immunization History  Administered Date(s) Administered   Tdap 11/02/2015     The following portions of the patient's history were reviewed and updated as appropriate: allergies, current medications, past medical history, past social history, past surgical history and problem list.  Objective:  There were no vitals filed for this visit.  Physical Exam Constitutional:       Appearance: Normal appearance.  HENT:     Head: Normocephalic and atraumatic.     Comments: No nits or hair loss    Mouth/Throat:     Mouth: Mucous membranes are moist. No oral lesions.     Pharynx: Oropharynx is clear. No oropharyngeal exudate or posterior oropharyngeal erythema.  Eyes:     General:        Right eye: No discharge.        Left eye: No discharge.     Conjunctiva/sclera:     Right eye: Right conjunctiva is not injected. No exudate.    Left eye: Left conjunctiva is not injected. No exudate. Pulmonary:     Effort: Pulmonary effort is normal.  Abdominal:     General: Abdomen is flat.     Palpations: Abdomen is soft. There is no hepatomegaly or mass.     Tenderness: There is no abdominal tenderness. There is no rebound.     Hernia: There is no hernia in the left inguinal area or right inguinal area.  Genitourinary:    Pubic Area: No rash or pubic lice (no nits).      Penis: No tenderness, discharge, swelling or lesions.      Testes: Normal.     Epididymis:     Right: Normal. No mass or tenderness.     Left: Normal. No mass or tenderness.     Rectum: Normal. No tenderness (no lesions or discharge).  Comments: Penile Discharge Amount: none Color:  none  Left fissure on penis shaft Lymphadenopathy:     Head:     Right side of head: No preauricular or posterior auricular adenopathy.     Left side of head: No preauricular or posterior auricular adenopathy.     Cervical: No cervical adenopathy.     Upper Body:     Right upper body: No supraclavicular, axillary or epitrochlear adenopathy.     Left upper body: No supraclavicular, axillary or epitrochlear adenopathy.     Lower Body: No right inguinal adenopathy. No left inguinal adenopathy.  Skin:    General: Skin is warm and dry.     Findings: No lesion or rash.  Neurological:     Mental Status: He is alert and oriented to person, place, and time.       Assessment and Plan:  Michael Finley is a 32 y.o.  male presenting to the [facility] Department for STI screening  1. Exposure to gonorrhea  - cefTRIAXone (ROCEPHIN) injection 500 mg  2. Screening for venereal disease -sore does not appear to be syphilis -does not appear to be herpes but testing done today -appears like a fissure on the left shaft of penis   - HBV Antigen/Antibody [facility] - HIV/HCV [place] [facility] - Syphilis Serology, [place] [facility] - Chlamydia/GC NAA, Confirmation - Virology, [place] [facility]   Patient does have STI symptoms Patient accepted all screenings including  urine GC/Chlamydia, and blood work for HIV/Syphilis. Patient meets criteria for HepB screening? Yes. Ordered? yes Patient meets criteria for HepC screening? Yes. Ordered? yes Recommended condom use with all sex Discussed importance of condom use for STI prevent  Treat positive test results per standing order. Discussed time line for [facility] results and that patient will be called with positive results and encouraged patient to call if he had not heard in 2 weeks Recommended repeat testing in 3 months with positive results. Recommended returning for continued or worsening symptoms.   Return if symptoms worsen or fail to improve, for STI screening.  No future appointments. Total time spent 20 minutes  Lenice Llamas, [place]

## 2023-02-05 NOTE — Progress Notes (Signed)
Pt is here for STD screening.  He is a contact to Gonorrhea and has a penile lesion.  Ceftriaxone 500 mg #14 given IM in rt deltoid.  Pt tolerated well. Ceftriaxone info sheet, Gonorrhea pamphlet and condoms given.  Berdie Ogren, RN

## 2023-02-08 LAB — CHLAMYDIA/GC NAA, CONFIRMATION
Chlamydia trachomatis, NAA: NEGATIVE
Neisseria gonorrhoeae, NAA: NEGATIVE

## 2023-02-11 ENCOUNTER — Telehealth: Payer: Self-pay | Admitting: Family Medicine

## 2024-01-18 ENCOUNTER — Observation Stay
Admission: EM | Admit: 2024-01-18 | Discharge: 2024-01-19 | Disposition: A | Discharge status: Home / Self Care | Payer: Self-pay | Attending: Urology | Admitting: Urology

## 2024-01-18 ENCOUNTER — Emergency Department: Payer: Self-pay | Admitting: General Practice

## 2024-01-18 ENCOUNTER — Emergency Department: Payer: Self-pay

## 2024-01-18 ENCOUNTER — Other Ambulatory Visit: Payer: Self-pay

## 2024-01-18 ENCOUNTER — Other Ambulatory Visit: Payer: Self-pay | Admitting: Urology

## 2024-01-18 ENCOUNTER — Encounter: Payer: Self-pay | Admitting: Emergency Medicine

## 2024-01-18 ENCOUNTER — Encounter
Admission: EM | Disposition: A | Discharge status: Home / Self Care | Payer: Self-pay | Source: Home / Self Care | Attending: Emergency Medicine

## 2024-01-18 DIAGNOSIS — X58XXXA Exposure to other specified factors, initial encounter: Secondary | ICD-10-CM | POA: Insufficient documentation

## 2024-01-18 DIAGNOSIS — Z419 Encounter for procedure for purposes other than remedying health state, unspecified: Secondary | ICD-10-CM

## 2024-01-18 DIAGNOSIS — Z9889 Other specified postprocedural states: Secondary | ICD-10-CM

## 2024-01-18 DIAGNOSIS — Z79899 Other long term (current) drug therapy: Secondary | ICD-10-CM | POA: Insufficient documentation

## 2024-01-18 DIAGNOSIS — N4889 Other specified disorders of penis: Secondary | ICD-10-CM

## 2024-01-18 DIAGNOSIS — S39840A Fracture of corpus cavernosum penis, initial encounter: Principal | ICD-10-CM

## 2024-01-18 DIAGNOSIS — F17210 Nicotine dependence, cigarettes, uncomplicated: Secondary | ICD-10-CM | POA: Insufficient documentation

## 2024-01-18 HISTORY — PX: CYSTOSCOPY: SHX5120

## 2024-01-18 HISTORY — PX: REPAIR OF FRACTURED PENIS: SHX6063

## 2024-01-18 LAB — BASIC METABOLIC PANEL WITH GFR
Anion gap: 9 (ref 5–15)
BUN: 11 mg/dL (ref 6–20)
CO2: 24 mmol/L (ref 22–32)
Calcium: 8.9 mg/dL (ref 8.9–10.3)
Chloride: 106 mmol/L (ref 98–111)
Creatinine, Ser: 0.86 mg/dL (ref 0.61–1.24)
GFR, Estimated: >60 mL/min (ref >60)
Glucose, Bld: 100 mg/dL — ABNORMAL HIGH (ref 70–99)
Potassium: 3.6 mmol/L (ref 3.5–5.1)
Sodium: 139 mmol/L (ref 135–145)

## 2024-01-18 LAB — URINALYSIS, W/ REFLEX TO CULTURE (INFECTION SUSPECTED)
Bacteria, UA: NONE SEEN
Bilirubin Urine: NEGATIVE
Glucose, UA: NEGATIVE mg/dL
Hgb urine dipstick: NEGATIVE
Ketones, ur: NEGATIVE mg/dL
Leukocytes,Ua: NEGATIVE
Nitrite: NEGATIVE
Protein, ur: NEGATIVE mg/dL
RBC / HPF: 0 RBC/hpf (ref 0–5)
Specific Gravity, Urine: 1.027 (ref 1.005–1.030)
pH: 5 (ref 5.0–8.0)

## 2024-01-18 LAB — CBC WITH DIFFERENTIAL/PLATELET
Abs Immature Granulocytes: 0.03 10*3/uL (ref 0.00–0.07)
Basophils Absolute: 0 10*3/uL (ref 0.0–0.1)
Basophils Relative: 0 %
Eosinophils Absolute: 0.1 10*3/uL (ref 0.0–0.5)
Eosinophils Relative: 2 %
HCT: 44.1 % (ref 39.0–52.0)
Hemoglobin: 15.1 g/dL (ref 13.0–17.0)
Immature Granulocytes: 0 %
Lymphocytes Relative: 32 %
Lymphs Abs: 3 10*3/uL (ref 0.7–4.0)
MCH: 29.8 pg (ref 26.0–34.0)
MCHC: 34.2 g/dL (ref 30.0–36.0)
MCV: 87.2 fL (ref 80.0–100.0)
Monocytes Absolute: 0.7 10*3/uL (ref 0.1–1.0)
Monocytes Relative: 7 %
Neutro Abs: 5.6 10*3/uL (ref 1.7–7.7)
Neutrophils Relative %: 59 %
Platelets: 299 10*3/uL (ref 150–400)
RBC: 5.06 MIL/uL (ref 4.22–5.81)
RDW: 13.2 % (ref 11.5–15.5)
WBC: 9.5 10*3/uL (ref 4.0–10.5)
nRBC: 0 % (ref 0.0–0.2)

## 2024-01-18 LAB — CHLAMYDIA/NGC RT PCR (ARMC ONLY)
Chlamydia Tr: NOT DETECTED
N gonorrhoeae: NOT DETECTED

## 2024-01-18 LAB — TYPE AND SCREEN
ABO/RH(D): O POS
Antibody Screen: NEGATIVE

## 2024-01-18 LAB — PROTIME-INR
INR: 1 (ref 0.8–1.2)
Prothrombin Time: 13 s (ref 11.4–15.2)

## 2024-01-18 LAB — ETHANOL: Alcohol, Ethyl (B): 34 mg/dL — ABNORMAL HIGH (ref <15)

## 2024-01-18 SURGERY — REPAIR, FRACTURE, PENIS
Anesthesia: General | Site: Urethra

## 2024-01-18 MED ORDER — PROPOFOL 10 MG/ML IV BOLUS
INTRAVENOUS | Status: DC | PRN
  Administered 2024-01-18: 200 mg via INTRAVENOUS

## 2024-01-18 MED ORDER — ONDANSETRON HCL 4 MG/2ML IJ SOLN: 4.0000 mg | INTRAMUSCULAR | Status: DC | PRN

## 2024-01-18 MED ORDER — ACETAMINOPHEN 10 MG/ML IV SOLN
INTRAVENOUS | Status: DC | PRN
  Administered 2024-01-18: 1000 mg via INTRAVENOUS

## 2024-01-18 MED ORDER — HYDROMORPHONE HCL 1 MG/ML IJ SOLN
INTRAMUSCULAR | Status: AC
  Filled 2024-01-18: qty 1

## 2024-01-18 MED ORDER — DEXMEDETOMIDINE HCL IN NACL 80 MCG/20ML IV SOLN
INTRAVENOUS | Status: DC | PRN
  Administered 2024-01-18 (×2): 8 ug via INTRAVENOUS
  Administered 2024-01-18: 12 ug via INTRAVENOUS

## 2024-01-18 MED ORDER — LIDOCAINE HCL (CARDIAC) PF 100 MG/5ML IV SOSY
PREFILLED_SYRINGE | INTRAVENOUS | Status: DC | PRN
  Administered 2024-01-18: 100 mg via INTRAVENOUS

## 2024-01-18 MED ORDER — MORPHINE SULFATE (PF) 2 MG/ML IV SOLN: 2.0000 mg | INTRAVENOUS | Status: DC | PRN

## 2024-01-18 MED ORDER — SODIUM CHLORIDE (PF) 0.9 % IJ SOLN
INTRAMUSCULAR | Status: DC | PRN
  Administered 2024-01-18 (×2): 50 mL

## 2024-01-18 MED ORDER — SODIUM CHLORIDE 0.9 % IV SOLN: 250.0000 mL | INTRAVENOUS | Status: DC | PRN

## 2024-01-18 MED ORDER — FENTANYL CITRATE (PF) 100 MCG/2ML IJ SOLN
INTRAMUSCULAR | Status: AC
  Filled 2024-01-18: qty 2

## 2024-01-18 MED ORDER — HYDROMORPHONE HCL 1 MG/ML IJ SOLN: 0.5000 mg | INTRAMUSCULAR | Status: DC | PRN

## 2024-01-18 MED ORDER — FENTANYL CITRATE (PF) 100 MCG/2ML IJ SOLN
INTRAMUSCULAR | Status: DC | PRN
  Administered 2024-01-18: 25 ug via INTRAVENOUS
  Administered 2024-01-18 (×2): 50 ug via INTRAVENOUS
  Administered 2024-01-18: 75 ug via INTRAVENOUS

## 2024-01-18 MED ORDER — OXYCODONE HCL 5 MG PO TABS
ORAL_TABLET | ORAL | Status: AC
  Filled 2024-01-18: qty 1

## 2024-01-18 MED ORDER — DOCUSATE SODIUM 100 MG PO CAPS
100.0000 mg | ORAL_CAPSULE | Freq: Two times a day (BID) | ORAL | Status: DC
Start: 2024-01-18 — End: 2024-01-19
  Administered 2024-01-18: 100 mg via ORAL
  Filled 2024-01-18: qty 1

## 2024-01-18 MED ORDER — OXYCODONE HCL 5 MG PO TABS
5.0000 mg | ORAL_TABLET | ORAL | Status: DC | PRN
  Administered 2024-01-18 – 2024-01-19 (×2): 5 mg via ORAL
  Filled 2024-01-18 (×2): qty 1

## 2024-01-18 MED ORDER — DEXAMETHASONE SODIUM PHOSPHATE 10 MG/ML IJ SOLN
10.0000 mg | Freq: Once | INTRAMUSCULAR | Status: AC
  Administered 2024-01-18: 10 mg via INTRAVENOUS
  Filled 2024-01-18: qty 1

## 2024-01-18 MED ORDER — DIPHENHYDRAMINE HCL 50 MG/ML IJ SOLN
50.0000 mg | Freq: Once | INTRAMUSCULAR | Status: AC
  Administered 2024-01-18: 50 mg via INTRAVENOUS
  Filled 2024-01-18: qty 1

## 2024-01-18 MED ORDER — CHLORHEXIDINE GLUCONATE CLOTH 2 % EX PADS
6.0000 | MEDICATED_PAD | Freq: Every day | CUTANEOUS | Status: DC
  Administered 2024-01-18: 6 via TOPICAL

## 2024-01-18 MED ORDER — DEXAMETHASONE SODIUM PHOSPHATE 10 MG/ML IJ SOLN
INTRAMUSCULAR | Status: DC | PRN
  Administered 2024-01-18: 10 mg via INTRAVENOUS

## 2024-01-18 MED ORDER — MORPHINE SULFATE (PF) 4 MG/ML IV SOLN
4.0000 mg | Freq: Once | INTRAVENOUS | Status: AC
  Administered 2024-01-18: 4 mg via INTRAVENOUS
  Filled 2024-01-18: qty 1

## 2024-01-18 MED ORDER — ONDANSETRON HCL 4 MG/2ML IJ SOLN
4.0000 mg | Freq: Once | INTRAMUSCULAR | Status: AC
  Administered 2024-01-18: 4 mg via INTRAVENOUS
  Filled 2024-01-18: qty 2

## 2024-01-18 MED ORDER — LIDOCAINE HCL URETHRAL/MUCOSAL 2 % EX GEL
1.0000 | Freq: Once | CUTANEOUS | Status: AC
  Administered 2024-01-18: 1 via URETHRAL
  Filled 2024-01-18: qty 5

## 2024-01-18 MED ORDER — SODIUM CHLORIDE 0.9% FLUSH
3.0000 mL | Freq: Two times a day (BID) | INTRAVENOUS | Status: DC
  Administered 2024-01-18: 3 mL via INTRAVENOUS

## 2024-01-18 MED ORDER — BUPIVACAINE HCL (PF) 0.25 % IJ SOLN
INTRAMUSCULAR | Status: AC
  Filled 2024-01-18: qty 30

## 2024-01-18 MED ORDER — MIDAZOLAM HCL 2 MG/2ML IJ SOLN
INTRAMUSCULAR | Status: AC
  Filled 2024-01-18: qty 2

## 2024-01-18 MED ORDER — HYDROMORPHONE HCL 1 MG/ML IJ SOLN
0.5000 mg | INTRAMUSCULAR | Status: DC | PRN
  Administered 2024-01-18 (×2): 0.5 mg via INTRAVENOUS
  Administered 2024-01-18: 1 mg via INTRAVENOUS

## 2024-01-18 MED ORDER — FENTANYL CITRATE (PF) 100 MCG/2ML IJ SOLN
25.0000 ug | INTRAMUSCULAR | Status: DC | PRN
Start: 2024-01-18 — End: 2024-01-18

## 2024-01-18 MED ORDER — ONDANSETRON HCL 4 MG/2ML IJ SOLN
INTRAMUSCULAR | Status: DC | PRN
  Administered 2024-01-18: 4 mg via INTRAVENOUS

## 2024-01-18 MED ORDER — GADOBUTROL 1 MMOL/ML IV SOLN
10.0000 mL | Freq: Once | INTRAVENOUS | Status: AC | PRN
  Administered 2024-01-18: 10 mL via INTRAVENOUS

## 2024-01-18 MED ORDER — CEFAZOLIN SODIUM-DEXTROSE 2-3 GM-%(50ML) IV SOLR
INTRAVENOUS | Status: DC | PRN
  Administered 2024-01-18: 2 g via INTRAVENOUS

## 2024-01-18 MED ORDER — SODIUM CHLORIDE 0.9% FLUSH: 3.0000 mL | INTRAVENOUS | Status: DC | PRN

## 2024-01-18 MED ORDER — LACTATED RINGERS IV SOLN: INTRAVENOUS | Status: DC | PRN

## 2024-01-18 MED ORDER — ACETAMINOPHEN 10 MG/ML IV SOLN
INTRAVENOUS | Status: AC
  Filled 2024-01-18: qty 100

## 2024-01-18 MED ORDER — CEFAZOLIN SODIUM-DEXTROSE 2-4 GM/100ML-% IV SOLN
INTRAVENOUS | Status: AC
  Filled 2024-01-18: qty 100

## 2024-01-18 MED ORDER — MIDAZOLAM HCL 2 MG/2ML IJ SOLN
INTRAMUSCULAR | Status: DC | PRN
  Administered 2024-01-18: 2 mg via INTRAVENOUS

## 2024-01-18 MED ORDER — HYOSCYAMINE SULFATE 0.125 MG SL SUBL
0.1250 mg | SUBLINGUAL_TABLET | SUBLINGUAL | Status: DC | PRN
  Administered 2024-01-18: 0.25 mg via SUBLINGUAL
  Filled 2024-01-18 (×2): qty 2

## 2024-01-18 MED ORDER — OXYCODONE HCL 5 MG PO TABS
5.0000 mg | ORAL_TABLET | Freq: Once | ORAL | Status: AC | PRN
  Administered 2024-01-18: 5 mg via ORAL

## 2024-01-18 MED ORDER — OXYCODONE HCL 5 MG/5ML PO SOLN: 5.0000 mg | Freq: Once | ORAL | Status: AC | PRN

## 2024-01-18 MED ORDER — ALPRAZOLAM 0.25 MG PO TABS: 0.2500 mg | ORAL_TABLET | Freq: Three times a day (TID) | ORAL | Status: DC | PRN

## 2024-01-18 MED ORDER — ACETAMINOPHEN 325 MG PO TABS: 650.0000 mg | ORAL_TABLET | ORAL | Status: DC | PRN

## 2024-01-18 MED ORDER — BUPIVACAINE HCL 0.25 % IJ SOLN
INTRAMUSCULAR | Status: DC | PRN
  Administered 2024-01-18: 10 mL

## 2024-01-18 MED ORDER — CEFAZOLIN SODIUM-DEXTROSE 2-4 GM/100ML-% IV SOLN
2.0000 g | Freq: Once | INTRAVENOUS | Status: AC
  Administered 2024-01-18: 2 g via INTRAVENOUS
  Filled 2024-01-18: qty 100

## 2024-01-18 MED ORDER — SODIUM CHLORIDE 0.9 % IR SOLN
Status: DC | PRN
  Administered 2024-01-18: 1000 mL

## 2024-01-18 SURGICAL SUPPLY — 37 items
BAG DRAIN SIEMENS DORNER NS (MISCELLANEOUS) ×3 IMPLANT
BLADE SURG 15 STRL LF DISP TIS (BLADE) ×3 IMPLANT
BNDG COHESIVE 1X5 TAN NS LF (GAUZE/BANDAGES/DRESSINGS) IMPLANT
BNDG GAUZE DERMACEA FLUFF 4 (GAUZE/BANDAGES/DRESSINGS) IMPLANT
BRUSH SCRUB EZ 1% IODOPHOR (MISCELLANEOUS) ×3 IMPLANT
BRUSH SCRUB EZ 4% CHG (MISCELLANEOUS) ×3 IMPLANT
CATH URETL OPEN 5X70 (CATHETERS) ×3 IMPLANT
DRAIN PENROSE 5/8X18 LTX STRL (DRAIN) IMPLANT
DRAPE LAPAROTOMY 77X122 PED (DRAPES) ×3 IMPLANT
DRSG TELFA 3X4 N-ADH STERILE (GAUZE/BANDAGES/DRESSINGS) ×3 IMPLANT
DRSG XEROFORM 1X8 (GAUZE/BANDAGES/DRESSINGS) IMPLANT
ELECTRODE REM PT RTRN 9FT ADLT (ELECTROSURGICAL) IMPLANT
GAUZE 4X4 16PLY ~~LOC~~+RFID DBL (SPONGE) ×6 IMPLANT
GAUZE PETROLATUM 1 X8 (GAUZE/BANDAGES/DRESSINGS) IMPLANT
GAUZE STRETCH 2X75IN STRL (MISCELLANEOUS) ×3 IMPLANT
GAUZE XEROFORM 1X8 LF (GAUZE/BANDAGES/DRESSINGS) IMPLANT
GLOVE BIOGEL PI IND STRL 7.5 (GLOVE) ×3 IMPLANT
GOWN STRL REUS W/ TWL LRG LVL3 (GOWN DISPOSABLE) ×3 IMPLANT
GOWN STRL REUS W/ TWL XL LVL3 (GOWN DISPOSABLE) ×3 IMPLANT
GUIDEWIRE STR DUAL SENSOR (WIRE) ×3 IMPLANT
HOLDER FOLEY CATH W/STRAP (MISCELLANEOUS) IMPLANT
KIT TURNOVER KIT A (KITS) ×3 IMPLANT
MANIFOLD NEPTUNE II (INSTRUMENTS) ×3 IMPLANT
NDL HYPO 22X1.5 SAFETY MO (MISCELLANEOUS) ×3 IMPLANT
NEEDLE HYPO 22X1.5 SAFETY MO (MISCELLANEOUS) ×2 IMPLANT
PACK BASIN MINOR ARMC (MISCELLANEOUS) ×3 IMPLANT
PACK CYSTO AR (MISCELLANEOUS) ×3 IMPLANT
SOL .9 NS 3000ML IRR UROMATIC (IV SOLUTION) ×3 IMPLANT
SPONGE T-LAP 4X18 ~~LOC~~+RFID (SPONGE) IMPLANT
SURGILUBE 2OZ TUBE FLIPTOP (MISCELLANEOUS) ×3 IMPLANT
SUT CHROMIC 3 0 SH 27 (SUTURE) ×6 IMPLANT
SUT VIC AB 3-0 54X BRD REEL (SUTURE) IMPLANT
SUT VIC AB 3-0 SH 27X BRD (SUTURE) IMPLANT
SYR 10ML LL (SYRINGE) ×3 IMPLANT
TRAP FLUID SMOKE EVACUATOR (MISCELLANEOUS) ×3 IMPLANT
TRAY FOLEY MTR SLVR 16FR STAT (SET/KITS/TRAYS/PACK) IMPLANT
WATER STERILE IRR 500ML POUR (IV SOLUTION) ×3 IMPLANT

## 2024-01-18 NOTE — ED Notes (Signed)
Pt states still unable to provide urine sample.

## 2024-01-18 NOTE — Interval H&P Note (Signed)
 History and Physical Interval Note:  01/18/2024 2:28 PM  Michael Finley  has presented today for surgery, with the diagnosis of Penile fracture.  The various methods of treatment have been discussed with the patient and family. After consideration of risks, benefits and other options for treatment, the patient has consented to  Procedure(s): REPAIR, FRACTURE, PENIS (N/A) CYSTOSCOPY (flexible) (N/A) as a surgical intervention.  The patient's history has been reviewed, patient examined, no change in status, stable for surgery.  I have reviewed the patient's chart and labs.  Questions were answered to the patient's satisfaction.     Oda Bence

## 2024-01-18 NOTE — Consult Note (Signed)
 Urology Consult   Physician requesting consult: Verneda Golder, DO  Reason for consult: Penile fracture  History of Present Illness: Michael Finley is a 33 y.o. seen in consultation from Dr. Author Board for evaluation of a penile fracture.  He presented to the emergency room early this morning with acute onset of penile swelling and discomfort.  He noted symptoms during vaginal intercourse around 2 AM.  He did not feel a pop or an episode of significant pain.  He noticed that the dorsum of his penis was very swollen.  He reports pain with movement of the penis.  He voided prior to presenting to the emergency room.  No dysuria or gross hematuria. MRI showed a focal area of disruption of the dorsal aspect of the left tunica albuginea with an adjacent hematoma measuring 3 x 1.4 x 2.4 cm.  He has been NPO since 10 PM last night.  Past Medical History:  Diagnosis Date   Osteogenesis imperfecta    PTSD (post-traumatic stress disorder)     Past Surgical History:  Procedure Laterality Date   ELBOW ARTHROPLASTY Bilateral    HAND ARTHROPLASTY     HIP ARTHROSCOPY     MANDIBLE RECONSTRUCTION     STOMACH SURGERY     to remove ulcers.    Medications:  Scheduled Meds: Continuous Infusions: PRN Meds:.  Allergies: No Known Allergies  History reviewed. No pertinent family history.  Social History:  reports that he has been smoking cigarettes. He has never used smokeless tobacco. He reports current alcohol use. He reports current drug use. Drug: Marijuana.  ROS: A complete review of systems was performed.  All systems are negative except for pertinent findings as noted.  Physical Exam:  Vital signs in last 24 hours: Temp:  [97.7 F (36.5 C)] 97.7 F (36.5 C) (05/25 0350) Pulse Rate:  [82-109] 82 (05/25 0530) Resp:  [19] 19 (05/25 0350) BP: (106-134)/(68-88) 106/68 (05/25 0530) SpO2:  [97 %-98 %] 98 % (05/25 0530) Weight:  [99.8 kg] 99.8 kg (05/25 0350) GENERAL APPEARANCE:  Well appearing,  well developed, well nourished, NAD HEENT:  Atraumatic, normocephalic, oropharynx clear NECK:  Supple  CHEST:  CTA bilaterally CV:  RRR ABDOMEN:  Soft, non-tender, no masses EXTREMITIES:  Moves all extremities well, without clubbing, cyanosis, or edema NEUROLOGIC:  Alert and oriented x 3, CN II-XII grossly intact MENTAL STATUS:  appropriate BACK:  Non-tender to palpation, No CVAT SKIN:  Warm, dry, and intact GU:  significant edema of penile shaft with eggplant deformity; no ecchymosis; some tenderness to palpation on left penile shaft; glans normal; meatus normal  Laboratory Data:  Recent Labs    01/18/24 0436  WBC 9.5  HGB 15.1  HCT 44.1  PLT 299    Recent Labs    01/18/24 0436  NA 139  K 3.6  CL 106  GLUCOSE 100*  BUN 11  CALCIUM 8.9  CREATININE 0.86     Results for orders placed or performed during the hospital encounter of 01/18/24 (from the past 24 hours)  CBC with Differential/Platelet     Status: None   Collection Time: 01/18/24  4:36 AM  Result Value Ref Range   WBC 9.5 4.0 - 10.5 K/uL   RBC 5.06 4.22 - 5.81 MIL/uL   Hemoglobin 15.1 13.0 - 17.0 g/dL   HCT 16.1 09.6 - 04.5 %   MCV 87.2 80.0 - 100.0 fL   MCH 29.8 26.0 - 34.0 pg   MCHC 34.2 30.0 - 36.0 g/dL  RDW 13.2 11.5 - 15.5 %   Platelets 299 150 - 400 K/uL   nRBC 0.0 0.0 - 0.2 %   Neutrophils Relative % 59 %   Neutro Abs 5.6 1.7 - 7.7 K/uL   Lymphocytes Relative 32 %   Lymphs Abs 3.0 0.7 - 4.0 K/uL   Monocytes Relative 7 %   Monocytes Absolute 0.7 0.1 - 1.0 K/uL   Eosinophils Relative 2 %   Eosinophils Absolute 0.1 0.0 - 0.5 K/uL   Basophils Relative 0 %   Basophils Absolute 0.0 0.0 - 0.1 K/uL   Immature Granulocytes 0 %   Abs Immature Granulocytes 0.03 0.00 - 0.07 K/uL  Basic metabolic panel     Status: Abnormal   Collection Time: 01/18/24  4:36 AM  Result Value Ref Range   Sodium 139 135 - 145 mmol/L   Potassium 3.6 3.5 - 5.1 mmol/L   Chloride 106 98 - 111 mmol/L   CO2 24 22 - 32 mmol/L    Glucose, Bld 100 (H) 70 - 99 mg/dL   BUN 11 6 - 20 mg/dL   Creatinine, Ser 6.04 0.61 - 1.24 mg/dL   Calcium 8.9 8.9 - 54.0 mg/dL   GFR, Estimated >98 >11 mL/min   Anion gap 9 5 - 15   No results found for this or any previous visit (from the past 240 hours).  Renal Function: Recent Labs    01/18/24 0436  CREATININE 0.86   Estimated Creatinine Clearance: 144.9 mL/min (by C-G formula based on SCr of 0.86 mg/dL).  Radiologic Imaging: MR PELVIS W WO CONTRAST Result Date: 01/18/2024 CLINICAL DATA:  Evaluate for penile fracture. EXAM: MRI PELVIS WITHOUT AND WITH CONTRAST TECHNIQUE: Multiplanar multisequence MR imaging of the pelvis was performed both before and after administration of intravenous contrast. CONTRAST:  10mL GADAVIST GADOBUTROL 1 MMOL/ML IV SOLN COMPARISON:  None Available. FINDINGS: Urinary Tract:  Urinary bladder appears normal. Bowel:  Visualized bowel loops are nondilated. Vascular/Lymphatic: No pathologically enlarged lymph nodes. No significant vascular abnormality seen. Reproductive:  Prostate gland appears normal. There are small, bilateral hydroceles identified. Normal appearance of the testicles which appear normal in size with out focal abnormality. There is a focal area of disruption of the along the dorsal aspect of the left paramidline tunica albuginea,, image 11/2. Adjacent hematoma is identified measuring 3.0 x 1.4 by 2.4 cm. Diffuse expansion of dartos fascia is identified with overlying skin edema. Other:  No free fluid within the pelvis. Musculoskeletal: No suspicious bone lesions identified. IMPRESSION: 1. Findings compatible with traumatic injury of the penis with focal area of disruption of the dorsal aspect of the left paramidline tunica albuginea. Adjacent hematoma is identified measuring 3.0 x 1.4 x 2.4 cm. 2. Diffuse expansion of the dartos fascia with overlying skin edema. 3. Small, bilateral hydroceles. Electronically Signed   By: Kimberley Penman M.D.   On:  01/18/2024 07:36    I independently reviewed the above imaging studies.  Impression/Recommendation Penile fracture  Diagnosis and management of a penile fracture discussed with the patient.  Recommendations for surgical management discussed.  I advised him that surgical management is recommended within the first 24-48 hours to prevent long-term complications of erectile dysfunction. He understands and wishes to proceed with surgical management with flexible cystoscopy, repair of penile fracture.  Potential risk including but not limited to infection, bleeding, erectile dysfunction, penile curvature, need for additional procedures, and anesthetic complications reviewed.  Procedure: The patient will be scheduled for flexible cystoscopy, repair of penile  fracture at [facility].  Surgical request is placed with the surgery schedulers and will be scheduled at the patient's/family request. Informed consent is given as documented below. Anesthesia: General  The patient does not have sleep apnea, history of MRSA, history of VRE, history of cardiac device requiring special anesthetic needs. Patient is stable and considered clear for surgical management in an outpatient ambulatory surgery setting as well as inpatient hospital setting.  Consent for Operation or Procedure: Provider Certification I hereby certify that the nature, purpose, benefits, usual and most frequent risks of, and alternatives to, the operation or procedure have been explained to the patient (or person authorized to sign for the patient) either by me as responsible physician or by the provider who is to perform the operation or procedure. Time spent such that the patient/family has had an opportunity to ask questions, and that those questions have been answered. The patient or the patient's representative has been advised that selected tasks may be performed by assistants to the primary health care provider(s). I  believe that the patient (or person authorized to sign for the patient) understands what has been explained, and has consented to the operation or procedure. No guarantees were implied or made.   Oda Bence 01/18/2024, 8:07 AM

## 2024-01-18 NOTE — Transfer of Care (Signed)
 Immediate Anesthesia Transfer of Care Note  Patient: Michael Finley  Procedure(s) Performed: REPAIR, FRACTURE, PENIS (Penis) CYSTOSCOPY (flexible) (Urethra)  Patient Location: PACU  Anesthesia Type:General  Level of Consciousness: awake, alert , and oriented  Airway & Oxygen Therapy: Patient Spontanous Breathing and Patient connected to face mask oxygen  Post-op Assessment: Report given to RN and Post -op Vital signs reviewed and stable  Post vital signs: Reviewed and stable  Last Vitals:  Vitals Value Taken Time  BP 137/70 01/18/24 1641  Temp    Pulse 94 01/18/24 1643  Resp 15 01/18/24 1643  SpO2 99 % 01/18/24 1643  Vitals shown include unfiled device data.  Last Pain:  Vitals:   01/18/24 0732  TempSrc:   PainSc: Asleep         Complications: No notable events documented.

## 2024-01-18 NOTE — Anesthesia Preprocedure Evaluation (Signed)
 Anesthesia Evaluation  Patient identified by MRN, date of birth, ID band Patient awake    Reviewed: Allergy & Precautions, NPO status , Patient's Chart, lab work & pertinent test results  Airway Mallampati: III  TM Distance: >3 FB Neck ROM: full    Dental  (+) Dental Advidsory Given, Teeth Intact   Pulmonary neg pulmonary ROS, Current Smoker and Patient abstained from smoking.   Pulmonary exam normal        Cardiovascular negative cardio ROS Normal cardiovascular exam     Neuro/Psych  PSYCHIATRIC DISORDERS Anxiety     negative neurological ROS     GI/Hepatic negative GI ROS, Neg liver ROS,,,  Endo/Other  negative endocrine ROS    Renal/GU negative Renal ROS  negative genitourinary   Musculoskeletal   Abdominal   Peds  Hematology negative hematology ROS (+)   Anesthesia Other Findings Past Medical History: No date: Osteogenesis imperfecta No date: PTSD (post-traumatic stress disorder)  Past Surgical History: No date: ELBOW ARTHROPLASTY; Bilateral No date: HAND ARTHROPLASTY No date: HIP ARTHROSCOPY No date: MANDIBLE RECONSTRUCTION No date: STOMACH SURGERY     Comment:  to remove ulcers.  BMI    Body Mass Index: 32.49 kg/m      Reproductive/Obstetrics negative OB ROS                             Anesthesia Physical Anesthesia Plan  ASA: 2  Anesthesia Plan: General   Post-op Pain Management:    Induction: Intravenous  PONV Risk Score and Plan: 2 and Ondansetron , Dexamethasone and Midazolam  Airway Management Planned: LMA  Additional Equipment:   Intra-op Plan:   Post-operative Plan: Extubation in OR  Informed Consent: I have reviewed the patients History and Physical, chart, labs and discussed the procedure including the risks, benefits and alternatives for the proposed anesthesia with the patient or authorized representative who has indicated his/her understanding and  acceptance.     Dental Advisory Given  Plan Discussed with: Anesthesiologist, CRNA and Surgeon  Anesthesia Plan Comments: (Patient consented for risks of anesthesia including but not limited to:  - adverse reactions to medications - damage to eyes, teeth, lips or other oral mucosa - nerve damage due to positioning  - sore throat or hoarseness - Damage to heart, brain, nerves, lungs, other parts of body or loss of life  Patient voiced understanding and assent.)       Anesthesia Quick Evaluation

## 2024-01-18 NOTE — ED Triage Notes (Signed)
 Per pt "I think my penis exploded. I was having intercourse with my girlfriend and it got really swollen and painful."  Denies discharge, endorses dysuria after penile swelling

## 2024-01-18 NOTE — ED Notes (Signed)
 Family requesting to hold off on vitals until patient is awake

## 2024-01-18 NOTE — ED Provider Notes (Signed)
  Physical Exam  BP 106/68   Pulse 82   Temp 97.7 F (36.5 C) (Oral)   Resp 19   Ht 5\' 9"  (1.753 m)   Wt 99.8 kg   SpO2 98%   BMI 32.49 kg/m   Physical Exam  Procedures  Procedures  ED Course / MDM    Medical Decision Making Amount and/or Complexity of Data Reviewed Labs: ordered. Radiology: ordered.  Risk Prescription drug management. Decision regarding hospitalization.   Received signout on patient.  33 year old male presenting today for sudden onset penile discomfort and swelling.  Seen by initial provider and eventually had MRI which confirms traumatic injury to the penis with hematoma indicative of a penile fracture.  Patient signed out pending urology consultation.  Urology, Dr. Willye Harvey evaluated patient at the bedside.  Plan for OR this afternoon.  Likely will go home after the OR.  Patient to OR at time of shift ending with plan to go home afterwards.     Kandee Orion, MD 01/18/24 [phone]

## 2024-01-18 NOTE — Anesthesia Procedure Notes (Signed)
 Procedure Name: LMA Insertion Date/Time: 01/18/2024 2:44 PM  Performed by: Delice Felt, CRNAPre-anesthesia Checklist: Patient identified, Patient being monitored, Timeout performed, Emergency Drugs available and Suction available Patient Re-evaluated:Patient Re-evaluated prior to induction Oxygen Delivery Method: Circle system utilized Preoxygenation: Pre-oxygenation with 100% oxygen Induction Type: IV induction Ventilation: Mask ventilation without difficulty LMA: LMA inserted LMA Size: 4.0 Tube type: Oral Number of attempts: 1 Placement Confirmation: positive ETCO2 and breath sounds checked- equal and bilateral Tube secured with: Tape Dental Injury: Teeth and Oropharynx as per pre-operative assessment

## 2024-01-18 NOTE — ED Provider Notes (Signed)
 [facility] Provider Note    Event Date/Time   First MD Initiated Contact with Patient 01/18/24 0413     (approximate)   History   Groin Swelling   HPI  KEATYN JAWAD is a 33 y.o. male with history of osteogenesis imperfecta who presents to the emergency department with sudden onset penile discomfort and swelling.  Patient reports he was having vaginal intercourse with his partner about 30 minutes prior to arrival.  States that they were only having intercourse for several minutes and then he started to feel discomfort.  He denies any obvious traumatic injury to his penis or feeling a pop.  He states that he noticed that the dorsum of his penis was extremely swollen but no bruising.  No pain or swelling in the scrotum, testicles.  He was able to urinate prior to coming to the ED and denies dysuria, hematuria or discharge.  He has never had anything like this happen to him before.  States he has been drinking alcohol tonight.  Reports smoking marijuana occasionally.  No other drug use.  N.p.o. since 10:30 PM.   History provided by patient, significant other.    Past Medical History:  Diagnosis Date   Osteogenesis imperfecta    PTSD (post-traumatic stress disorder)     Past Surgical History:  Procedure Laterality Date   ELBOW ARTHROPLASTY Bilateral    HAND ARTHROPLASTY     HIP ARTHROSCOPY     MANDIBLE RECONSTRUCTION     STOMACH SURGERY     to remove ulcers.    MEDICATIONS:  Prior to Admission medications   Medication Sig Start Date End Date Taking? Authorizing Provider  oxyCODONE -acetaminophen  (PERCOCET) 5-325 MG tablet Take 1 tablet by mouth every 4 (four) hours as needed. Patient not taking: Reported on 02/05/2023 11/19/21   Isa Manuel, MD    Physical Exam   Triage Vital Signs: ED Triage Vitals [01/18/24 0350]  Encounter Vitals Group     BP 129/88     Systolic BP Percentile      Diastolic BP Percentile      Pulse Rate (!) 109      Resp 19     Temp 97.7 F (36.5 C)     Temp Source Oral     SpO2 97 %     Weight 220 lb (99.8 kg)     Height 5\' 9"  (1.753 m)     Head Circumference      Peak Flow      Pain Score      Pain Loc      Pain Education      Exclude from Growth Chart     Most recent vital signs: Vitals:   01/18/24 0500 01/18/24 0530  BP: 134/80 106/68  Pulse: 95 82  Resp:    Temp:    SpO2: 98% 98%    CONSTITUTIONAL: Alert, responds appropriately to questions.  Appears uncomfortable, tearful HEAD: Normocephalic, atraumatic EYES: Conjunctivae clear, pupils appear equal, sclera nonicteric ENT: normal nose; moist mucous membranes NECK: Supple, normal ROM CARD: RRR; S1 and S2 appreciated RESP: Normal chest excursion without splinting or tachypnea; breath sounds clear and equal bilaterally; no wheezes, no rhonchi, no rales, no hypoxia or respiratory distress, speaking full sentences ABD/GI: Non-distended; soft, non-tender, no rebound, no guarding, no peritoneal signs GU: Patient's penis has significant amount of soft tissue swelling and tenderness without bruising.  Glans appears normal with no blood or discharge at the urethral meatus.  Testicles nontender  to palpation without mass.  Normal-appearing scrotum without redness, warmth, crepitus.  Normal femoral pulses.  No hernia appreciated.  No high riding testicle.  No phimosis, paraphimosis.  Circumcised male.  Chaperone present for exam. BACK: The back appears normal EXT: Normal ROM in all joints; no deformity noted, no edema SKIN: Normal color for age and race; warm; no rash on exposed skin NEURO: Moves all extremities equally, normal speech PSYCH: The patient's mood and manner are appropriate.      Patient gave verbal permission to utilize photo for medical documentation only. The image was not stored on any personal device.  ED Results / Procedures / Treatments   LABS: (all labs ordered are listed, but only abnormal results are  displayed) Labs Reviewed  BASIC METABOLIC PANEL WITH GFR - Abnormal; Notable for the following components:      Result Value   Glucose, Bld 100 (*)    All other components within normal limits  CHLAMYDIA/NGC RT PCR ([facility] ONLY)            CBC WITH DIFFERENTIAL/PLATELET  URINALYSIS, W/ REFLEX TO CULTURE (INFECTION SUSPECTED)  PROTIME-INR  ETHANOL  TYPE AND SCREEN     EKG:  RADIOLOGY: My personal review and interpretation of imaging: MRI shows penile fracture.  I have personally reviewed all radiology reports.   MR PELVIS W WO CONTRAST Result Date: 01/18/2024 CLINICAL DATA:  Evaluate for penile fracture. EXAM: MRI PELVIS WITHOUT AND WITH CONTRAST TECHNIQUE: Multiplanar multisequence MR imaging of the pelvis was performed both before and after administration of intravenous contrast. CONTRAST:  10mL GADAVIST GADOBUTROL 1 MMOL/ML IV SOLN COMPARISON:  None Available. FINDINGS: Urinary Tract:  Urinary bladder appears normal. Bowel:  Visualized bowel loops are nondilated. Vascular/Lymphatic: No pathologically enlarged lymph nodes. No significant vascular abnormality seen. Reproductive:  Prostate gland appears normal. There are small, bilateral hydroceles identified. Normal appearance of the testicles which appear normal in size with out focal abnormality. There is a focal area of disruption of the along the dorsal aspect of the left paramidline tunica albuginea,, image 11/2. Adjacent hematoma is identified measuring 3.0 x 1.4 by 2.4 cm. Diffuse expansion of dartos fascia is identified with overlying skin edema. Other:  No free fluid within the pelvis. Musculoskeletal: No suspicious bone lesions identified. IMPRESSION: 1. Findings compatible with traumatic injury of the penis with focal area of disruption of the dorsal aspect of the left paramidline tunica albuginea. Adjacent hematoma is identified measuring 3.0 x 1.4 x 2.4 cm. 2. Diffuse expansion of the dartos fascia with overlying skin edema. 3.  Small, bilateral hydroceles. Electronically Signed   By: Kimberley Penman M.D.   On: 01/18/2024 07:36     PROCEDURES:  Critical Care performed: Yes, see critical care procedure note(s)   CRITICAL CARE Performed by: Starling Eck Dezirae Service   Total critical care time: 45 minutes  Critical care time was exclusive of separately billable procedures and treating other patients.  Critical care was necessary to treat or prevent imminent or life-threatening deterioration.  Critical care was time spent personally by me on the following activities: development of treatment plan with patient and/or surrogate as well as nursing, discussions with consultants, evaluation of patient's response to treatment, examination of patient, obtaining history from patient or surrogate, ordering and performing treatments and interventions, ordering and review of laboratory studies, ordering and review of radiographic studies, pulse oximetry and re-evaluation of patient's condition.   Procedures    IMPRESSION / MDM / ASSESSMENT AND PLAN / ED COURSE  I  reviewed the triage vital signs and the nursing notes.    Patient here with sudden onset penile pain, swelling.     DIFFERENTIAL DIAGNOSIS (includes but not limited to):   Soft tissue swelling could be related to friction injury, allergic reaction, penile fracture.  No obvious signs of cellulitis, balanitis, paraphimosis, phimosis.  Doubt testicular torsion, scrotal abscess or cellulitis based on exam.  No signs of Fournier's gangrene.  No other signs of volume overload to suggest nephrotic syndrome, cirrhosis, CHF.   Patient's presentation is most consistent with acute presentation with potential threat to life or bodily function.   PLAN: Discussed case with Dr. Secundino Dach on-call for urology.  We both feel that penile fracture is less likely given no trauma or direct traumatic injury, bruising but will closely monitor.  Will apply ice, give pain and nausea medicine, give  Decadron and Benadryl  in case there is any component of allergic reaction.   MEDICATIONS GIVEN IN ED: Medications  diphenhydrAMINE  (BENADRYL ) injection 50 mg (50 mg Intravenous Given 01/18/24 0446)  dexamethasone (DECADRON) injection 10 mg (10 mg Intravenous Given 01/18/24 0445)  morphine (PF) 4 MG/ML injection 4 mg (4 mg Intravenous Given 01/18/24 0442)  ondansetron  (ZOFRAN ) injection 4 mg (4 mg Intravenous Given 01/18/24 0440)  gadobutrol (GADAVIST) 1 MMOL/ML injection 10 mL (10 mLs Intravenous Contrast Given 01/18/24 0620)     ED COURSE: Patient monitored for about 30 minutes after ice in place and no significant improvement in his swelling.  I am concerned clinically that I cannot rule out penile fracture with just my exam alone given how swollen he is.  Did discuss the case with Dr. Martin Slay on-call for radiology.  Will obtain MRI of the pelvis with and without contrast.    Patient MRI reviewed and interpreted by myself and the radiologist and does show disruption of the tunica albuginea with associated hematoma.  Discussed the case with Dr. Willye Harvey now on-call for urology.  He will see patient in the emergency department and take him to the operating room today.  Patient has been n.p.o. since arrival.  Patient and girlfriend updated with plan.  Pain has been well-controlled after 1 dose of morphine.     CONSULTS: Consulted urology.  Appreciate their help.   OUTSIDE RECORDS REVIEWED: Reviewed last orthopedic note in April 2020.       FINAL CLINICAL IMPRESSION(S) / ED DIAGNOSES   Final diagnoses:  Penile swelling  Penile fracture, initial encounter     Rx / DC Orders   ED Discharge Orders     None        Note:  This document was prepared using Dragon voice recognition software and may include unintentional dictation errors.   Jadin Kagel, Clover Dao, DO 01/18/24 [phone]

## 2024-01-18 NOTE — Anesthesia Postprocedure Evaluation (Signed)
 Anesthesia Post Note  Patient: Michael Finley  Procedure(s) Performed: REPAIR, FRACTURE, PENIS (Penis) CYSTOSCOPY (flexible) (Urethra)  Patient location during evaluation: PACU Anesthesia Type: General Level of consciousness: awake and alert Pain management: pain level controlled Vital Signs Assessment: post-procedure vital signs reviewed and stable Respiratory status: spontaneous breathing, nonlabored ventilation, respiratory function stable and patient connected to nasal cannula oxygen Cardiovascular status: blood pressure returned to baseline and stable Postop Assessment: no apparent nausea or vomiting Anesthetic complications: no  No notable events documented.   Last Vitals:  Vitals:   01/18/24 1844 01/18/24 2046  BP: (!) 141/93 133/80  Pulse:  98  Resp:  20  Temp:  36.7 C  SpO2:  98%    Last Pain:  Vitals:   01/18/24 2253  TempSrc:   PainSc: 0-No pain                 Enrique Harvest

## 2024-01-18 NOTE — H&P (View-Only) (Signed)
 Urology Consult   Physician requesting consult: Verneda Golder, DO  Reason for consult: Penile fracture  History of Present Illness: Michael Finley is a 33 y.o. seen in consultation from Dr. Author Board for evaluation of a penile fracture.  He presented to the emergency room early this morning with acute onset of penile swelling and discomfort.  He noted symptoms during vaginal intercourse around 2 AM.  He did not feel a pop or an episode of significant pain.  He noticed that the dorsum of his penis was very swollen.  He reports pain with movement of the penis.  He voided prior to presenting to the emergency room.  No dysuria or gross hematuria. MRI showed a focal area of disruption of the dorsal aspect of the left tunica albuginea with an adjacent hematoma measuring 3 x 1.4 x 2.4 cm.  He has been NPO since 10 PM last night.  Past Medical History:  Diagnosis Date   Osteogenesis imperfecta    PTSD (post-traumatic stress disorder)     Past Surgical History:  Procedure Laterality Date   ELBOW ARTHROPLASTY Bilateral    HAND ARTHROPLASTY     HIP ARTHROSCOPY     MANDIBLE RECONSTRUCTION     STOMACH SURGERY     to remove ulcers.    Medications:  Scheduled Meds: Continuous Infusions: PRN Meds:.  Allergies: No Known Allergies  History reviewed. No pertinent family history.  Social History:  reports that he has been smoking cigarettes. He has never used smokeless tobacco. He reports current alcohol use. He reports current drug use. Drug: Marijuana.  ROS: A complete review of systems was performed.  All systems are negative except for pertinent findings as noted.  Physical Exam:  Vital signs in last 24 hours: Temp:  [97.7 F (36.5 C)] 97.7 F (36.5 C) (05/25 0350) Pulse Rate:  [82-109] 82 (05/25 0530) Resp:  [19] 19 (05/25 0350) BP: (106-134)/(68-88) 106/68 (05/25 0530) SpO2:  [97 %-98 %] 98 % (05/25 0530) Weight:  [99.8 kg] 99.8 kg (05/25 0350) GENERAL APPEARANCE:  Well appearing,  well developed, well nourished, NAD HEENT:  Atraumatic, normocephalic, oropharynx clear NECK:  Supple  CHEST:  CTA bilaterally CV:  RRR ABDOMEN:  Soft, non-tender, no masses EXTREMITIES:  Moves all extremities well, without clubbing, cyanosis, or edema NEUROLOGIC:  Alert and oriented x 3, CN II-XII grossly intact MENTAL STATUS:  appropriate BACK:  Non-tender to palpation, No CVAT SKIN:  Warm, dry, and intact GU:  significant edema of penile shaft with eggplant deformity; no ecchymosis; some tenderness to palpation on left penile shaft; glans normal; meatus normal  Laboratory Data:  Recent Labs    01/18/24 0436  WBC 9.5  HGB 15.1  HCT 44.1  PLT 299    Recent Labs    01/18/24 0436  NA 139  K 3.6  CL 106  GLUCOSE 100*  BUN 11  CALCIUM 8.9  CREATININE 0.86     Results for orders placed or performed during the hospital encounter of 01/18/24 (from the past 24 hours)  CBC with Differential/Platelet     Status: None   Collection Time: 01/18/24  4:36 AM  Result Value Ref Range   WBC 9.5 4.0 - 10.5 K/uL   RBC 5.06 4.22 - 5.81 MIL/uL   Hemoglobin 15.1 13.0 - 17.0 g/dL   HCT 16.1 09.6 - 04.5 %   MCV 87.2 80.0 - 100.0 fL   MCH 29.8 26.0 - 34.0 pg   MCHC 34.2 30.0 - 36.0 g/dL  RDW 13.2 11.5 - 15.5 %   Platelets 299 150 - 400 K/uL   nRBC 0.0 0.0 - 0.2 %   Neutrophils Relative % 59 %   Neutro Abs 5.6 1.7 - 7.7 K/uL   Lymphocytes Relative 32 %   Lymphs Abs 3.0 0.7 - 4.0 K/uL   Monocytes Relative 7 %   Monocytes Absolute 0.7 0.1 - 1.0 K/uL   Eosinophils Relative 2 %   Eosinophils Absolute 0.1 0.0 - 0.5 K/uL   Basophils Relative 0 %   Basophils Absolute 0.0 0.0 - 0.1 K/uL   Immature Granulocytes 0 %   Abs Immature Granulocytes 0.03 0.00 - 0.07 K/uL  Basic metabolic panel     Status: Abnormal   Collection Time: 01/18/24  4:36 AM  Result Value Ref Range   Sodium 139 135 - 145 mmol/L   Potassium 3.6 3.5 - 5.1 mmol/L   Chloride 106 98 - 111 mmol/L   CO2 24 22 - 32 mmol/L    Glucose, Bld 100 (H) 70 - 99 mg/dL   BUN 11 6 - 20 mg/dL   Creatinine, Ser 1.61 0.61 - 1.24 mg/dL   Calcium 8.9 8.9 - 09.6 mg/dL   GFR, Estimated >04 >54 mL/min   Anion gap 9 5 - 15   No results found for this or any previous visit (from the past 240 hours).  Renal Function: Recent Labs    01/18/24 0436  CREATININE 0.86   Estimated Creatinine Clearance: 144.9 mL/min (by C-G formula based on SCr of 0.86 mg/dL).  Radiologic Imaging: MR PELVIS W WO CONTRAST Result Date: 01/18/2024 CLINICAL DATA:  Evaluate for penile fracture. EXAM: MRI PELVIS WITHOUT AND WITH CONTRAST TECHNIQUE: Multiplanar multisequence MR imaging of the pelvis was performed both before and after administration of intravenous contrast. CONTRAST:  10mL GADAVIST  GADOBUTROL  1 MMOL/ML IV SOLN COMPARISON:  None Available. FINDINGS: Urinary Tract:  Urinary bladder appears normal. Bowel:  Visualized bowel loops are nondilated. Vascular/Lymphatic: No pathologically enlarged lymph nodes. No significant vascular abnormality seen. Reproductive:  Prostate gland appears normal. There are small, bilateral hydroceles identified. Normal appearance of the testicles which appear normal in size with out focal abnormality. There is a focal area of disruption of the along the dorsal aspect of the left paramidline tunica albuginea,, image 11/2. Adjacent hematoma is identified measuring 3.0 x 1.4 by 2.4 cm. Diffuse expansion of dartos fascia is identified with overlying skin edema. Other:  No free fluid within the pelvis. Musculoskeletal: No suspicious bone lesions identified. IMPRESSION: 1. Findings compatible with traumatic injury of the penis with focal area of disruption of the dorsal aspect of the left paramidline tunica albuginea. Adjacent hematoma is identified measuring 3.0 x 1.4 x 2.4 cm. 2. Diffuse expansion of the dartos fascia with overlying skin edema. 3. Small, bilateral hydroceles. Electronically Signed   By: Kimberley Penman M.D.   On:  01/18/2024 07:36    I independently reviewed the above imaging studies.  Impression/Recommendation Penile fracture  Diagnosis and management of a penile fracture discussed with the patient.  Recommendations for surgical management discussed.  I advised him that surgical management is recommended within the first 24-48 hours to prevent long-term complications of erectile dysfunction. He understands and wishes to proceed with surgical management with flexible cystoscopy, repair of penile fracture.  Potential risk including but not limited to infection, bleeding, erectile dysfunction, penile curvature, need for additional procedures, and anesthetic complications reviewed.  Procedure: The patient will be scheduled for flexible cystoscopy, repair of penile  fracture at [facility].  Surgical request is placed with the surgery schedulers and will be scheduled at the patient's/family request. Informed consent is given as documented below. Anesthesia: General  The patient does not have sleep apnea, history of MRSA, history of VRE, history of cardiac device requiring special anesthetic needs. Patient is stable and considered clear for surgical management in an outpatient ambulatory surgery setting as well as inpatient hospital setting.  Consent for Operation or Procedure: Provider Certification I hereby certify that the nature, purpose, benefits, usual and most frequent risks of, and alternatives to, the operation or procedure have been explained to the patient (or person authorized to sign for the patient) either by me as responsible physician or by the provider who is to perform the operation or procedure. Time spent such that the patient/family has had an opportunity to ask questions, and that those questions have been answered. The patient or the patient's representative has been advised that selected tasks may be performed by assistants to the primary health care provider(s). I  believe that the patient (or person authorized to sign for the patient) understands what has been explained, and has consented to the operation or procedure. No guarantees were implied or made.   Oda Bence 01/18/2024, 8:07 AM

## 2024-01-18 NOTE — Op Note (Signed)
 OPERATIVE NOTE   Patient Name: Michael Finley  MRN: [id number]   Date of Procedure: 01/18/24    Preoperative diagnosis: Penile fracture  Postoperative diagnosis:  Penile fracture Traumatic disruption of superficial dorsal vein  Procedure:  Flexible cystoscopy Repair of penile fracture  Attending: Mellie Sprinkle, MD  Anesthesia: General with local  Estimated blood loss: 100 mL  Fluids: Per anesthesia record  Drains: Foley catheter  Specimens: None  Antibiotics: Ancef 2 g IV  Findings:  - Significant clot involving the entire penile shaft - Apparent traumatic disruption of superficial dorsal vein - Small tear in left corporal body midshaft lateral to urethra - Normal urethra and bladder  Indications:  33 year old male presented to the emergency room early this morning with acute onset of penile swelling and pain.  He noticed symptoms during vaginal intercourse around 2 AM this morning.  He did not feel an obvious pop or an episode of significant pain.  He noticed that the dorsum of his penis was very swollen.  He reports pain with movement of the penis.  He was able to void spontaneously prior to coming to the emergency room.  He did not have any dysuria or gross hematuria.  MRI evaluation demonstrated a focal area of disruption involving the left corporal body with an adjacent hematoma.  He presents now for surgical management with cystoscopy and repair of penile fracture.  The procedure including potential risk was discussed with the patient in detail.  Risk of infection, bleeding, injury to surrounding structures, erectile dysfunction, penile curvature, need for additional procedures, and anesthetic complications discussed.  He understands and wishes to proceed as described.  Description of Procedure:  The patient received IV Ancef preoperatively.  He was taken to the operating room suite and properly identified.  After successful induction of a general anesthetic,  the patient was placed on the operating table in the supine position.  A preoperative timeout was performed.  The patient's genital area and lower abdomen were prepped and draped in sterile fashion. Flexible cystoscopy was performed for evaluation of the patient's urethra.  No urethral abnormalities were seen.  There was no evidence of any urethral injury.  The bladder was inspected and noted to be normal as well.  A 16 French Foley catheter was placed into the bladder with 10 mL of sterile water  in the balloon.  A circumferential incision was made behind the glans.  The penis was degloved revealing a large volume of clot within the subcutaneous tissue.  The clot was almost circumferential making dissection somewhat difficult.  The clot was evacuated from the subcutaneous tissue.  Bleeding was noted dorsally.  There appeared to be a traumatic disruption of the superficial dorsal vein.  The ends were clamped and ligated using 3-0 Vicryl ties.  Good hemostasis was obtained.  Dissection was carried down to the proximal shaft.  I did not initially appreciate an obvious tear in the corporal bodies.  A artificial erection was performed using normal saline and a butterfly.  A small area of leakage was noted on the left corporal body mid shaft just lateral to the urethra.  This did correspond to the area seen on MRI.  The overlying tissue was carefully dissected away.  A small tear in the tunic was noted.  This was closed with interrupted 3-0 Vicryl suture.  Repeat artificial erection failed to demonstrate any additional leakage or other areas of injury.  Meticulous hemostasis was obtained.  The skin edges were then approximated using  interrupted 3-0 chromic sutures.  10 mL of 0.2% plain Marcaine was used as a penile block.  A sterile dressing was applied with Xeroform, Kling, and Coban.  The Foley catheter was left in place at the end of the procedure.  The patient was then extubated and taken to the postanesthesia care  unit in stable condition.  All counts were correct at the end of the procedure.  Complications: None  Condition: Stable, extubated, transferred to PACU  Plan:  Will admit for observation. Continue Foley catheter overnight.

## 2024-01-18 NOTE — ED Notes (Signed)
 Dr. Willye Harvey at the bedside.

## 2024-01-18 NOTE — ED Notes (Signed)
Pt states still unable to void.

## 2024-01-19 ENCOUNTER — Encounter: Payer: Self-pay | Admitting: Urology

## 2024-01-19 MED ORDER — OXYCODONE HCL 5 MG PO TABS: 5.0000 mg | ORAL_TABLET | Freq: Four times a day (QID) | ORAL | 0 refills | Status: DC | PRN

## 2024-01-19 NOTE — Progress Notes (Signed)
 Urology Inpatient Progress Note  Subjective: Doing well post-op.  No pain. Foley draining well.  Anti-infectives: Anti-infectives (From admission, onward)    Start     Dose/Rate Route Frequency Ordered Stop   01/18/24 0945  ceFAZolin (ANCEF) IVPB 2g/100 mL premix        2 g 200 mL/hr over 30 Minutes Intravenous  Once 01/18/24 0934 01/18/24 1313       Current Facility-Administered Medications  Medication Dose Route Frequency Provider Last Rate Last Admin   0.9 %  sodium chloride  infusion  250 mL Intravenous PRN Antrice Pal, Ponce Brisker., MD       acetaminophen  (TYLENOL ) tablet 650 mg  650 mg Oral Q4H PRN Allani Reber, Ponce Brisker., MD       ALPRAZolam (XANAX) tablet 0.25 mg  0.25 mg Oral TID PRN Willye Harvey, Ponce Brisker., MD       Chlorhexidine Gluconate Cloth 2 % PADS 6 each  6 each Topical Daily Mellie Sprinkle., MD   6 each at 01/18/24 1831   docusate sodium (COLACE) capsule 100 mg  100 mg Oral BID Matison Nuccio J., MD   100 mg at 01/18/24 2046   hyoscyamine (LEVSIN SL) SL tablet 0.125-0.25 mg  0.125-0.25 mg Sublingual Q4H PRN Mellie Sprinkle., MD   0.25 mg at 01/18/24 1726   morphine (PF) 2 MG/ML injection 2-4 mg  2-4 mg Intravenous Q2H PRN Allin Frix, Ponce Brisker., MD       ondansetron  (ZOFRAN ) injection 4 mg  4 mg Intravenous Q4H PRN Shady Padron, Ponce Brisker., MD       oxyCODONE  (Oxy IR/ROXICODONE ) immediate release tablet 5 mg  5 mg Oral Q4H PRN Mellie Sprinkle., MD   5 mg at 01/18/24 2208   sodium chloride  flush (NS) 0.9 % injection 3 mL  3 mL Intravenous Q12H Mellie Sprinkle., MD   3 mL at 01/18/24 2047   sodium chloride  flush (NS) 0.9 % injection 3 mL  3 mL Intravenous PRN Mellie Sprinkle., MD         Objective: Vital signs in last 24 hours: Temp:  [97.4 F (36.3 C)-98.6 F (37 C)] 98.6 F (37 C) (05/26 0505) Pulse Rate:  [80-98] 81 (05/26 0505) Resp:  [10-20] 20 (05/26 0505) BP: (126-161)/(70-114) 131/82 (05/26 0505) SpO2:  [96 %-100 %] 99 % (05/26  0505)  Intake/Output from previous day: 05/25 0701 - 05/26 0700 In: 850 [I.V.:800; IV Piggyback:50] Out: 220 [Urine:200; Blood:20] Intake/Output this shift: No intake/output data recorded.  GENERAL APPEARANCE:  Well appearing, well developed, well nourished, NAD ABDOMEN:  Soft, non-tender, no masses EXTREMITIES:  Moves all extremities well, without clubbing, cyanosis, or edema NEUROLOGIC:  Alert and oriented x 3,  CN II-XII grossly intact MENTAL STATUS:  appropriate BACK:  Non-tender to palpation, No CVAT SKIN:  Warm, dry, and intact GU:  dressing intact; foley draining clear urine   Lab Results:  Recent Labs    01/18/24 0436  WBC 9.5  HGB 15.1  HCT 44.1  PLT 299   BMET Recent Labs    01/18/24 0436  NA 139  K 3.6  CL 106  CO2 24  GLUCOSE 100*  BUN 11  CREATININE 0.86  CALCIUM 8.9   PT/INR Recent Labs    01/18/24 0803  LABPROT 13.0  INR 1.0   ABG No results for input(s): "PHART", "HCO3" in the last 72 hours.  Invalid input(s): "PCO2", "PO2"  Studies/Results: MR PELVIS W WO CONTRAST Result Date: 01/18/2024 CLINICAL DATA:  Evaluate for penile  fracture. EXAM: MRI PELVIS WITHOUT AND WITH CONTRAST TECHNIQUE: Multiplanar multisequence MR imaging of the pelvis was performed both before and after administration of intravenous contrast. CONTRAST:  10mL GADAVIST GADOBUTROL 1 MMOL/ML IV SOLN COMPARISON:  None Available. FINDINGS: Urinary Tract:  Urinary bladder appears normal. Bowel:  Visualized bowel loops are nondilated. Vascular/Lymphatic: No pathologically enlarged lymph nodes. No significant vascular abnormality seen. Reproductive:  Prostate gland appears normal. There are small, bilateral hydroceles identified. Normal appearance of the testicles which appear normal in size with out focal abnormality. There is a focal area of disruption of the along the dorsal aspect of the left paramidline tunica albuginea,, image 11/2. Adjacent hematoma is identified measuring 3.0 x  1.4 by 2.4 cm. Diffuse expansion of dartos fascia is identified with overlying skin edema. Other:  No free fluid within the pelvis. Musculoskeletal: No suspicious bone lesions identified. IMPRESSION: 1. Findings compatible with traumatic injury of the penis with focal area of disruption of the dorsal aspect of the left paramidline tunica albuginea. Adjacent hematoma is identified measuring 3.0 x 1.4 x 2.4 cm. 2. Diffuse expansion of the dartos fascia with overlying skin edema. 3. Small, bilateral hydroceles. Electronically Signed   By: Kimberley Penman M.D.   On: 01/18/2024 07:36     Assessment & Plan: POD #1 s/p repair of penile fracture.  Doing well. D/C foley D/C home   Oda Bence, MD 01/19/2024

## 2024-01-19 NOTE — Discharge Summary (Signed)
 Patient ID: Michael Finley MRN: [id number] DOB/AGE: 31/10/1990 31 y.o.  Admit date: 01/18/2024 Discharge date: 01/19/2024  Primary Care Physician:  Patient, No Pcp Per  Discharge Diagnoses:   Present on Admission:  Penile fracture   Consults:  none   Discharge Medications: Allergies as of 01/19/2024   No Known Allergies      Medication List     STOP taking these medications    oxyCODONE -acetaminophen  5-325 MG tablet Commonly known as: Percocet       TAKE these medications    oxyCODONE  5 MG immediate release tablet Commonly known as: Oxy IR/ROXICODONE  Take 1 tablet (5 mg total) by mouth every 6 (six) hours as needed for moderate pain (pain score 4-6) or severe pain (pain score 7-10).         Significant Diagnostic Studies:  MR PELVIS W WO CONTRAST Result Date: 01/18/2024 CLINICAL DATA:  Evaluate for penile fracture. EXAM: MRI PELVIS WITHOUT AND WITH CONTRAST TECHNIQUE: Multiplanar multisequence MR imaging of the pelvis was performed both before and after administration of intravenous contrast. CONTRAST:  10mL GADAVIST GADOBUTROL 1 MMOL/ML IV SOLN COMPARISON:  None Available. FINDINGS: Urinary Tract:  Urinary bladder appears normal. Bowel:  Visualized bowel loops are nondilated. Vascular/Lymphatic: No pathologically enlarged lymph nodes. No significant vascular abnormality seen. Reproductive:  Prostate gland appears normal. There are small, bilateral hydroceles identified. Normal appearance of the testicles which appear normal in size with out focal abnormality. There is a focal area of disruption of the along the dorsal aspect of the left paramidline tunica albuginea,, image 11/2. Adjacent hematoma is identified measuring 3.0 x 1.4 by 2.4 cm. Diffuse expansion of dartos fascia is identified with overlying skin edema. Other:  No free fluid within the pelvis. Musculoskeletal: No suspicious bone lesions identified. IMPRESSION: 1. Findings compatible with traumatic injury of the  penis with focal area of disruption of the dorsal aspect of the left paramidline tunica albuginea. Adjacent hematoma is identified measuring 3.0 x 1.4 x 2.4 cm. 2. Diffuse expansion of the dartos fascia with overlying skin edema. 3. Small, bilateral hydroceles. Electronically Signed   By: Kimberley Penman M.D.   On: 01/18/2024 07:36    Brief H and P: For complete details please refer to admission H and P, but in brief 33 year old male admitted for surgical management of penile fracture.  Hospital Course:  The patient was admitted to the hospital on 01/18/2024.  He was taken to the operating room where he underwent flexible cystoscopy and repair of penile fracture.  Please see operative note for details.  The patient's postoperative course was unremarkable.  He remained afebrile and hemodynamically stable.  His pain was well-controlled.  He had good urine output.  His Foley was discontinued on postoperative day #1.  He was felt to be stable for discharge home.  Day of Discharge BP 131/82 (BP Location: Right Arm)   Pulse 81   Temp 98.6 F (37 C) (Oral)   Resp 20   Ht 5\' 9"  (1.753 m)   Wt 99.8 kg   SpO2 99%   BMI 32.49 kg/m   Results for orders placed or performed during the hospital encounter of 01/18/24 (from the past 24 hours)  Urinalysis, w/ Reflex to Culture (Infection Suspected) -Urine, Clean Catch     Status: Abnormal   Collection Time: 01/18/24  2:00 PM  Result Value Ref Range   Specimen Source URINE, CLEAN CATCH    Color, Urine YELLOW (A) YELLOW   APPearance CLEAR (A) CLEAR  Specific Gravity, Urine 1.027 1.005 - 1.030   pH 5.0 5.0 - 8.0   Glucose, UA NEGATIVE NEGATIVE mg/dL   Hgb urine dipstick NEGATIVE NEGATIVE   Bilirubin Urine NEGATIVE NEGATIVE   Ketones, ur NEGATIVE NEGATIVE mg/dL   Protein, ur NEGATIVE NEGATIVE mg/dL   Nitrite NEGATIVE NEGATIVE   Leukocytes,Ua NEGATIVE NEGATIVE   RBC / HPF 0 0 - 5 RBC/hpf   WBC, UA 0-5 0 - 5 WBC/hpf   Bacteria, UA NONE SEEN NONE SEEN    Squamous Epithelial / HPF 0-5 0 - 5 /HPF   Mucus PRESENT   Chlamydia/NGC rt PCR ([facility] only)     Status: None   Collection Time: 01/18/24  2:00 PM   Specimen: Urine, Clean Catch  Result Value Ref Range   Specimen source GC/Chlam URINE, RANDOM    Chlamydia Tr NOT DETECTED NOT DETECTED   N gonorrhoeae NOT DETECTED NOT DETECTED    Physical Exam: General: Alert and awake oriented x3 not in any acute distress. HEENT: anicteric sclera, pupils reactive to light and accommodation CVS: S1-S2 clear no murmur rubs or gallops Chest: clear to auscultation bilaterally, no wheezing rales or rhonchi Abdomen: soft nontender, nondistended, normal bowel sounds, no organomegaly Extremities: no cyanosis, clubbing or edema noted bilaterally Neuro: Cranial nerves II-XII intact, no focal neurological deficits GU:  dressing intact, foley draining clear urine  Disposition:  Home   Diet:  Regular  Activity:  No lifting or strenuous activity x 2 weeks, no sex x 6 weeks   TESTS THAT NEED FOLLOW-UP   None  DISCHARGE FOLLOW-UP   Follow-up Information     Duey Liller, Ponce Brisker., MD. Schedule an appointment as soon as possible for a visit in 4 week(s).   Specialty: Urology Contact information: [address] [place] [address] [phone]                 Time spent on Discharge:   15 min  Signed: Oda Bence 01/19/2024, 8:23 AM

## 2024-01-19 NOTE — Progress Notes (Signed)
 Discharge instructions and med details reviewed with patient. All questions answered. Printed AVS given to patient.  IV removed. Foley catheter removed and patient was able to void after removal. Patient escorted out via wheelchair.   Arick Mareno G Patino Hernandez, RN

## 2024-01-19 NOTE — TOC CM/SW Note (Signed)
 Transition of Care [facility]) - Inpatient Brief Assessment   Patient Details  Name: Michael Finley MRN: [id number] Date of Birth: October 04, 1990  [facility]) CM/SW Contact:    Loman Risk, RN Phone Number: 01/19/2024, 9:04 AM   Clinical Narrative:   Transition of Care [facility]) Screening Note   Patient Details  Name: Michael Finley Date of Birth: [birth year]   Transition of Care [facility]) CM/SW Contact:    Loman Risk, RN Phone Number: 01/19/2024, 9:04 AM    Transition of Care Department [facility]) has reviewed patient and no TOC needs have been identified at this time.. If new patient transition needs arise, please place a TOC consult.    Transition of Care Asessment: Insurance and Status: Selfpay Patient has primary care physician: No (List of local PCP added to AVS)     Prior/Current Home Services: No current home services Social Drivers of Health Review: SDOH reviewed no interventions necessary Readmission risk has been reviewed: Yes Transition of care needs: no transition of care needs at this time

## 2024-01-20 ENCOUNTER — Telehealth: Payer: Self-pay | Admitting: Urology

## 2024-01-20 NOTE — Telephone Encounter (Signed)
 Called patient to schedule. Phone just rings. No voicemail available. If patient calls back please schedule. Also sent a mychart message to return call.

## 2024-01-20 NOTE — Telephone Encounter (Signed)
-----   Message from Oda Bence sent at 01/19/2024  8:26 AM EDT ----- Please schedule appt for 4 weeks

## 2024-04-18 ENCOUNTER — Encounter: Payer: Self-pay | Admitting: Emergency Medicine

## 2024-04-18 ENCOUNTER — Other Ambulatory Visit: Payer: Self-pay

## 2024-04-18 ENCOUNTER — Emergency Department
Admission: EM | Admit: 2024-04-18 | Discharge: 2024-04-24 | Disposition: A | Discharge status: Home / Self Care | Payer: Self-pay | Attending: Emergency Medicine | Admitting: Emergency Medicine

## 2024-04-18 DIAGNOSIS — R45851 Suicidal ideations: Secondary | ICD-10-CM | POA: Insufficient documentation

## 2024-04-18 DIAGNOSIS — R451 Restlessness and agitation: Secondary | ICD-10-CM | POA: Insufficient documentation

## 2024-04-18 DIAGNOSIS — Z79899 Other long term (current) drug therapy: Secondary | ICD-10-CM | POA: Insufficient documentation

## 2024-04-18 DIAGNOSIS — R45850 Homicidal ideations: Secondary | ICD-10-CM | POA: Insufficient documentation

## 2024-04-18 DIAGNOSIS — F32A Depression, unspecified: Secondary | ICD-10-CM

## 2024-04-18 DIAGNOSIS — F332 Major depressive disorder, recurrent severe without psychotic features: Secondary | ICD-10-CM | POA: Insufficient documentation

## 2024-04-18 DIAGNOSIS — R4689 Other symptoms and signs involving appearance and behavior: Secondary | ICD-10-CM

## 2024-04-18 LAB — COMPREHENSIVE METABOLIC PANEL WITH GFR
ALT: 41 U/L (ref 0–44)
AST: 38 U/L (ref 15–41)
Albumin: 4.5 g/dL (ref 3.5–5.0)
Alkaline Phosphatase: 78 U/L (ref 38–126)
Anion gap: 11 (ref 5–15)
BUN: 11 mg/dL (ref 6–20)
CO2: 25 mmol/L (ref 22–32)
Calcium: 9.5 mg/dL (ref 8.9–10.3)
Chloride: 103 mmol/L (ref 98–111)
Creatinine, Ser: 0.94 mg/dL (ref 0.61–1.24)
GFR, Estimated: >60 mL/min (ref >60)
Glucose, Bld: 93 mg/dL (ref 70–99)
Potassium: 3.2 mmol/L — ABNORMAL LOW (ref 3.5–5.1)
Sodium: 139 mmol/L (ref 135–145)
Total Bilirubin: 1.2 mg/dL (ref 0.0–1.2)
Total Protein: 8.2 g/dL — ABNORMAL HIGH (ref 6.5–8.1)

## 2024-04-18 LAB — CBC
HCT: 46.4 % (ref 39.0–52.0)
Hemoglobin: 16 g/dL (ref 13.0–17.0)
MCH: 29.6 pg (ref 26.0–34.0)
MCHC: 34.5 g/dL (ref 30.0–36.0)
MCV: 85.8 fL (ref 80.0–100.0)
Platelets: 375 K/uL (ref 150–400)
RBC: 5.41 MIL/uL (ref 4.22–5.81)
RDW: 12.1 % (ref 11.5–15.5)
WBC: 12.3 K/uL — ABNORMAL HIGH (ref 4.0–10.5)
nRBC: 0 % (ref 0.0–0.2)

## 2024-04-18 LAB — ETHANOL: Alcohol, Ethyl (B): <15 mg/dL (ref <15)

## 2024-04-18 MED ORDER — ALUM & MAG HYDROXIDE-SIMETH 200-200-20 MG/5ML PO SUSP: 30.0000 mL | Freq: Four times a day (QID) | ORAL | Status: DC | PRN

## 2024-04-18 MED ORDER — ONDANSETRON HCL 4 MG PO TABS: 4.0000 mg | ORAL_TABLET | Freq: Three times a day (TID) | ORAL | Status: DC | PRN

## 2024-04-18 MED ORDER — IBUPROFEN 600 MG PO TABS: 600.0000 mg | ORAL_TABLET | Freq: Three times a day (TID) | ORAL | Status: DC | PRN

## 2024-04-18 NOTE — ED Provider Notes (Addendum)
 [facility] Provider Note    Event Date/Time   First MD Initiated Contact with Patient 04/18/24 2204     (approximate)   History   Chief Complaint: homicidal plan  HPI  Michael Finley is a 33 y.o. male with a history of mood disorder who is brought to the ED under involuntary commitment initiated by [facility] due to plan to acquire a gun to kill himself and his girlfriend due to learning she was dating another person as well.  Denies any self-injurious behaviors thus far, denies any pain or medical complaints.       Physical Exam   Triage Vital Signs: ED Triage Vitals [04/18/24 2029]  Encounter Vitals Group     BP (!) 128/98     Girls Systolic BP Percentile      Girls Diastolic BP Percentile      Boys Systolic BP Percentile      Boys Diastolic BP Percentile      Pulse Rate 86     Resp 20     Temp 98.8 F (37.1 C)     Temp Source Oral     SpO2 98 %     Weight 213 lb (96.6 kg)     Height 5' 9 (1.753 m)     Head Circumference      Peak Flow      Pain Score 0     Pain Loc      Pain Education      Exclude from Growth Chart     Most recent vital signs: Vitals:   04/18/24 2029  BP: (!) 128/98  Pulse: 86  Resp: 20  Temp: 98.8 F (37.1 C)  SpO2: 98%    General: Awake, no distress. CV:  Good peripheral perfusion.  Resp:  Normal effort.  Abd:  No distention.  Other:  No wounds   ED Results / Procedures / Treatments   Labs (all labs ordered are listed, but only abnormal results are displayed) Labs Reviewed  COMPREHENSIVE METABOLIC PANEL WITH GFR  ETHANOL  CBC  URINE DRUG SCREEN, QUALITATIVE ([facility] ONLY)     EKG    RADIOLOGY    PROCEDURES:  Procedures   MEDICATIONS ORDERED IN ED: Medications  ibuprofen  (ADVIL ) tablet 600 mg (has no administration in time range)  ondansetron  (ZOFRAN ) tablet 4 mg (has no administration in time range)  alum & mag hydroxide-simeth (MAALOX/MYLANTA) 200-200-20 MG/5ML suspension 30 mL  (has no administration in time range)     IMPRESSION / MDM / ASSESSMENT AND PLAN / ED COURSE  I reviewed the triage vital signs and the nursing notes.  Patient's presentation is most consistent with acute presentation with potential threat to life or bodily function.  Patient presents with SI and HI without plan.  Continue IVC, consult psychiatry.  Medically stable.  The patient has been placed in psychiatric observation due to the need to provide a safe environment for the patient while obtaining psychiatric consultation and evaluation, as well as ongoing medical and medication management to treat the patient's condition.  The patient has been placed under full IVC at this time.      FINAL CLINICAL IMPRESSION(S) / ED DIAGNOSES   Final diagnoses:  Suicidal thoughts  Homicidal thoughts     Rx / DC Orders   ED Discharge Orders     None        Note:  This document was prepared using Dragon voice recognition software and may include unintentional dictation  errors.   Viviann Pastor, MD 04/18/24 2206    Viviann Pastor, MD 04/18/24 [phone]

## 2024-04-18 NOTE — BH Assessment (Incomplete)
 Comprehensive Clinical Assessment (CCA) Screening, Triage and Referral Note  04/18/2024 Amyr Kivon Fuerst [id number] Nicandro Perrault. [place], [place] speaking, Black male. Pt presented to [facility] ED under IVC. Per triage note: Patient presents with police from [facility] with IVC papers. Patient originally presented with voluntarily to [facility] with plans to buy a gun and kill himself and his girlfriend.  Subjective: Patient presented to the ED in visible emotional distress, tearful throughout the assessment. Initially guarded, the patient became more cooperative as the session progressed. He stated, "I went to [facility] because I needed to talk to somebody one to one." Patient endorsed symptoms of worsening depression and a decline in his ability to cope. He identified multiple psychosocial stressors including a recent breakup with his long-term girlfriend, upcoming court dates, unemployment, and the threat of eviction. He reported frequent crying spells, including at work, which led to him quitting his job. Patient also disclosed a history of childhood abandonment and neglect, stating he feels like "that same abandoned 59-year-old." He is not currently engaged in psychiatric services. Patient admitted to cannabis use, which he finds helpful. Objective: Psychomotor activity: Normal Speech: Clear and coherent Orientation: Alert and oriented x4 Mood: Depressed Affect: Congruent Eye contact: Normal Thought process: Relevant and coherent Insight: Fair Judgment: Intact Behavior: Tearful, visibly distressed Safety: Denied non-suicidal self-injurious behavior (NSSIB), suicidal ideation (SI), homicidal ideation (HI), and auditory/visual hallucinations (AV/H) Assessment: Patient presents with symptoms consistent with major depressive disorder, exacerbated by acute psychosocial stressors and unresolved trauma. Emotional distress is significant, though safety risk is currently low based on patient report. Cannabis use is present but  not reported as impairing functioning. Lack of current psychiatric care is a concern. Chief Complaint: No chief complaint on file.  Visit Diagnosis: MDD, recurrent, severe  Patient Reported Information How did you hear about us ? No data recorded What Is the Reason for Your Visit/Call Today? No data recorded How Long Has This Been Causing You Problems? No data recorded What Do You Feel Would Help You the Most Today? No data recorded  Have You Recently Had Any Thoughts About Hurting Yourself? No data recorded Are You Planning to Commit Suicide/Harm Yourself At This time? No data recorded  Have you Recently Had Thoughts About Hurting Someone Sherral? No data recorded Are You Planning to Harm Someone at This Time? No data recorded Explanation: No data recorded  Have You Used Any Alcohol or Drugs in the Past 24 Hours? No data recorded How Long Ago Did You Use Drugs or Alcohol? No data recorded What Did You Use and How Much? No data recorded  Do You Currently Have a Therapist/Psychiatrist? No data recorded Name of Therapist/Psychiatrist: No data recorded  Have You Been Recently Discharged From Any Office Practice or Programs? No data recorded Explanation of Discharge From Practice/Program: No data recorded   CCA Screening Triage Referral Assessment Type of Contact: No data recorded Telemedicine Service Delivery:   Is this Initial or Reassessment?   Date Telepsych consult ordered in [facility]:    Time Telepsych consult ordered in [facility]:    Location of Assessment: No data recorded Provider Location: No data recorded   Collateral Involvement: No data recorded  Does Patient Have a Court Appointed Legal Guardian? No data recorded Name and Contact of Legal Guardian: No data recorded If Minor and Not Living with Parent(s), Who has Custody? No data recorded Is CPS involved or ever been involved? No data recorded Is APS involved or ever been involved? No data recorded  Patient Determined To  Be At  Risk for Harm To Self or Others Based on Review of Patient Reported Information or Presenting Complaint? No data recorded Method: No data recorded Availability of Means: No data recorded Intent: No data recorded Notification Required: No data recorded Additional Information for Danger to Others Potential: No data recorded Additional Comments for Danger to Others Potential: No data recorded Are There Guns or Other Weapons in Your Home? No data recorded Types of Guns/Weapons: No data recorded Are These Weapons Safely Secured?                            No data recorded Who Could Verify You Are Able To Have These Secured: No data recorded Do You Have any Outstanding Charges, Pending Court Dates, Parole/Probation? No data recorded Contacted To Inform of Risk of Harm To Self or Others: No data recorded  Does Patient Present under Involuntary Commitment? No data recorded   [place] of Residence: No data recorded  Patient Currently Receiving the Following Services: No data recorded  Determination of Need: No data recorded  Options For Referral: No data recorded  Disposition Recommendation per psychiatric provider: Pending psych consult  Jaksen Fiorella R Mikyah Alamo, LCAS

## 2024-04-18 NOTE — ED Notes (Addendum)
 Patient dressed in hospital scrubs with RN and NT.   Patients belongings include:  Black shirt, black pants, black socks, tennis shoes, cellphone

## 2024-04-18 NOTE — ED Notes (Signed)
 Patient refusing blood work. RN and NT explained reasoning to blood work to patient. Patient began to became frustrated and slammed his cell phone on floor. After multiple attempts to get patient to cooperate with blood work, patient stated you are going to have to strap me down to get my blood work. Patient currently sitting with [employer].

## 2024-04-18 NOTE — ED Notes (Signed)
 This RN introduced self to pt. Pt calm and cooperative at this time. Pt denies SI/HI and let this RN draw labs. Pt has no other needs at this time.

## 2024-04-18 NOTE — ED Notes (Signed)
 ivc prior to arrival/psysh consult ordered/pending.

## 2024-04-18 NOTE — ED Triage Notes (Signed)
 Patient presents with police from [facility] with IVC papers. Patient originally presented with voluntarily to [facility] with plans to buy a gun and kill himself and his girlfriend.

## 2024-04-18 NOTE — ED Notes (Signed)
 TTS at bedside.

## 2024-04-19 ENCOUNTER — Encounter: Payer: Self-pay | Admitting: Psychiatry

## 2024-04-19 DIAGNOSIS — F332 Major depressive disorder, recurrent severe without psychotic features: Secondary | ICD-10-CM

## 2024-04-19 MED ORDER — MELATONIN 5 MG PO TABS
2.5000 mg | ORAL_TABLET | Freq: Every day | ORAL | Status: DC
  Administered 2024-04-19 – 2024-04-23 (×5): 2.5 mg via ORAL
  Filled 2024-04-19 (×5): qty 1

## 2024-04-19 MED ORDER — LORAZEPAM 1 MG PO TABS
1.0000 mg | ORAL_TABLET | Freq: Once | ORAL | Status: AC
  Administered 2024-04-19: 1 mg via ORAL
  Filled 2024-04-19: qty 1

## 2024-04-19 MED ORDER — ZIPRASIDONE MESYLATE 20 MG IM SOLR
20.0000 mg | Freq: Once | INTRAMUSCULAR | Status: AC
  Administered 2024-04-20: 20 mg via INTRAMUSCULAR
  Filled 2024-04-19: qty 20

## 2024-04-19 NOTE — Progress Notes (Addendum)
 Dr. Jadapalle recommended pt for inpatient psych admission.  Per [facility], the adult unit is at capacity.  Patient has been referred to the following out of network facilities:  Service Provider Phone  [facility]  [phone]  [facility]  [phone]  [facility]  [phone]  [facility]  [phone]  [facility]  [phone]  [facility]  [phone]  [facility]  [phone]  [facility]  [phone]  [facility]  [phone]  [facility]  [phone]  [facility]  [phone]  [facility]  [phone]  [facility]  [phone]  [facility]  [phone]  [facility]  [phone]  [facility]  [address] [place] [phone]

## 2024-04-19 NOTE — ED Notes (Signed)
Pt given warm blankets as requested.

## 2024-04-19 NOTE — ED Notes (Signed)
 IVC prior to arrival/[facility]/Pending consult

## 2024-04-19 NOTE — ED Notes (Signed)
 Whiling checking on pt this RN found pt laying in the bed with his clothes taken off. Pt was covered with blankets and states I can't sleep with clothes on. This RN told pt he needs to keep his clothes on at all time. Pt ignored this Charity fundraiser. This RN told pt that he needs to provide a urine sample, Pt states I already did all that. This RN reassured pt that we have not collect a urine specimen from him yet. Pt said alright, whatever man.

## 2024-04-19 NOTE — ED Notes (Addendum)
 Upon entering the pt's room to ask him a question, pt appeared to be irritated that I was in there talking to him. He was stand offish in his answers and very dismissive. Pt not being verbally/physically threatening, however obviously annoyed and irritated with staff when bothered.

## 2024-04-19 NOTE — ED Provider Notes (Incomplete)
11:58 PM

## 2024-04-19 NOTE — BH Assessment (Signed)
 Pt refusing to participate in psych eval. Psych team to follow up when pt is more willing to participate.

## 2024-04-19 NOTE — ED Notes (Addendum)
 PT given breakfast meal. Pt lying in bed and wrapped up with his blankets. He advised he was not hungry, he needed another blanket, and he needs something to help him sleep. Dr. Willo made aware of pt's requests. Pt denies any SI or HI at this time.

## 2024-04-19 NOTE — ED Notes (Signed)
 Pt informed that he can't have anything to aid in sleeping until he speaks with psych. Pt given a blanket.

## 2024-04-19 NOTE — ED Notes (Signed)
 Upon every rounding today, pt has been seen lying down with blankets covering his body including his head. Whenever contact is made with pt, he advises he has not slept and can not sleep, although he appears to be sleeping on rounding and entering his room.

## 2024-04-19 NOTE — ED Notes (Addendum)
 Pt stated that he is ready to go home. I asked him if he was having anymore SI or HI, and he advised he does not want to talk to anyone anymore and is ready to go. Pt was notified that he is under IVC and can not leave until he speaks with psych.

## 2024-04-19 NOTE — Consult Note (Signed)
 [facility] Psychiatric Consult Initial  Patient Name: .Michael Finley  MRN: [id number]  DOB: [birth year]  Consult Order details:  Orders (From admission, onward)     Start     Ordered   04/18/24 2205  CONSULT TO CALL ACT TEAM       Ordering Provider: Viviann Pastor, MD  Provider:  (Not yet assigned)  Question:  Reason for Consult?  Answer:  Psych consult   04/18/24 2204   04/18/24 2205  IP CONSULT TO PSYCHIATRY       Ordering Provider: Viviann Pastor, MD  Provider:  (Not yet assigned)  Question Answer Comment  Consult Timeframe URGENT - requires response within 12 hours   URGENT timeframe requires provider to provider communication, has the provider to provider communication been completed Yes   Reason for Consult? Consult for medication management   Contact phone number where the requesting provider can be reached [phone]      04/18/24 2204             Mode of Visit: In person    Psychiatry Consult Evaluation  Service Date: April 19, 2024 LOS:  LOS: 0 days  Chief Complaint SI/HI  Primary Psychiatric Diagnoses  MDD recurrent severe with out psychosis   Assessment  Michael Finley is a 33 y.o. male admitted: Presented to the [facility], [place] speaking, Black male. Pt presented to [facility] ED under IVC. Per triage note: Patient presents with police from [facility] with IVC papers. Patient originally presented with voluntarily to [facility] with plans to buy a gun and kill himself and his girlfriend.  Patient was accompanied to the [facility] emergency room for further evaluation.  Psychiatry was consulted for safety evaluation.  On assessment patient remained superficial, denies SI/HI, demanding to be discharged home but endorses hopelessness, anhedonia, severe depression and is refusing care stating nothing helps and he tried it all.  Patient reports history of previous hospitalizations, multiple trials of medications with poor response, multiple psychosocial stressors  including no job, at the verge of being evicted, some relationship problems that is not willing to share.  Patient remains high risk for suicide given his mood lability, refusal to participate in assessments and safety planning, refusing treatment.  Recommend to maintain IVC and recommend inpatient psychiatric admission     Diagnoses:  Active Hospital problems: Active Problems:   * No active hospital problems. *    Plan   ## Psychiatric Medication Recommendations:  Patient is not willing to discuss psychotropic medications at this time.  Will recommend inpatient psychiatric unit to continue to work with patient on medication management decisions  ## Medical Decision Making Capacity: Not specifically addressed in this encounter  ## Further Work-up:   No additional workup required at this time   ## Disposition:-- We recommend inpatient psychiatric hospitalization after medical hospitalization. Patient has been involuntarily committed on 04/19/2024.   ## Behavioral / Environmental: -Utilize compassion and acknowledge the patient's experiences while setting clear and realistic expectations for care.    ## Safety and Observation Level:  - Based on my clinical evaluation, I estimate the patient to be at high risk of self harm in the current setting. - At this time, we recommend  1:1 Observation. This decision is based on my review of the chart including patient's history and current presentation, interview of the patient, mental status examination, and consideration of suicide risk including evaluating suicidal ideation, plan, intent, suicidal or self-harm behaviors, risk factors, and protective factors. This judgment is  based on our ability to directly address suicide risk, implement suicide prevention strategies, and develop a safety plan while the patient is in the clinical setting. Please contact our team if there is a concern that risk level has changed.  CSSR Risk Category:C-SSRS RISK  CATEGORY: No Risk  Suicide Risk Assessment: Patient has following modifiable risk factors for suicide: active suicidal ideation, untreated depression, social isolation, and medication noncompliance, which we are addressing by inpatient psychiatric admission. Patient has following non-modifiable or demographic risk factors for suicide: male gender and psychiatric hospitalization Patient has the following protective factors against suicide: Supportive friends  Thank you for this consult request. Recommendations have been communicated to the primary team.  We will sign off at this time.   Nelle Sayed, MD       History of Present Illness  Relevant Aspects of Hospital ED   Patient Report:  Patient is noted to be resting in bed with blanket on.  He initially refused to talk to the provider saying he needs to go home and he does not want to say anything.  When provider gave him space to discuss about his life.  He reports that he went to [facility] as he was having a manic episode but then they IVC with him and brought him to the emergency room.  When provider asked about the suicidal and homicidal ideation and statements he made at [facility] patient is noted to be minimizing his statements stating he is not having any suicidal or homicidal ideation.  When asked about his mood he is noted to be tearful and crying throughout the interview saying he is depressed all his life and saying there is nothing to look forward to as he lost his job and is unable to pay the rent.  When asked about outpatient mental health resources he denies having any psychiatrist and reports that he came to [facility] to get outpatient services set up but he got IVC to be brought to the emergency room which she states I should never have gone there .  He reports that he was hospitalized multiple times and was tried on multiple psychotropics and he claims nothing works.  He refused to talk about any relationship problems.  He is not responding to  internal stimuli.  He endorses feeling hopeless and worthless and anhedonia stating nothing interest him anymore.  He denies auditory/visual hallucinations.  He is denying any substance use.  Interview was minimal as patient was not cooperative    Collateral information:  Patient is refusing to provide any collateral contact numbers    Psychiatric and Social History  Psychiatric History:  Information collected from patient/chart  Prev Dx/Sx: Depression Current Psych Provider: Had first appointment at [facility] but had to be IVC Home Meds (current): None reported Previous Med Trials: Not willing to disclose Therapy: None reported  Prior Psych Hospitalization: Multiple, details unknown Prior Self Harm: Denies Prior Violence: Denies  Family Psych History: Unknown Family Hx suicide: Denies  Social History:   Educational Hx: Unknown Occupational Hx: Unemployed Legal Hx: Unknown Living Situation: Lives by himself Spiritual Hx: Unknown Access to weapons/lethal means: Denies  Substance History Alcohol: Denies  Tobacco: Denies Illicit drugs: Denies Prescription drug abuse: Denies Rehab hx: Denies  Exam Findings  Physical Exam: Reviewed and agree with the physical exam findings conducted by the medical provider Vital Signs:  Temp:  [98.5 F (36.9 C)-98.9 F (37.2 C)] 98.5 F (36.9 C) (08/25 1919) Pulse Rate:  [80-86] 80 (08/25 1919) Resp:  [  14-20] 14 (08/25 1919) BP: (128-134)/(81-98) 128/81 (08/25 1919) SpO2:  [98 %-100 %] 99 % (08/25 1919) Weight:  [96.6 kg] 96.6 kg (08/24 2029) Blood pressure 128/81, pulse 80, temperature 98.5 F (36.9 C), temperature source Oral, resp. rate 14, height 5' 9 (1.753 m), weight 96.6 kg, SpO2 99%. Body mass index is 31.45 kg/m.    Mental Status Exam: General Appearance: Casual  Orientation:  Full (Time, Place, and Person)  Memory:  Immediate;   Fair Recent;   Fair Remote;   Fair  Concentration:  Concentration: Fair and Attention  Span: Fair  Recall:  Fair  Attention  Poor  Eye Contact:  Minimal  Speech:  Clear and Coherent  Language:  Fair  Volume:  Normal  Mood: depressed  Affect:  Tearful  Thought Process:  Coherent  Thought Content:  Logical  Suicidal Thoughts:  Yes.  without intent/plan  Homicidal Thoughts:  Yes.  without intent/plan  Judgement:  Impaired  Insight:  Shallow  Psychomotor Activity:  Decreased  Akathisia:  No  Fund of Knowledge:  Fair      Assets:  Manufacturing systems engineer Housing  Cognition:  WNL  ADL's:  Intact  AIMS (if indicated):        Other History   These have been pulled in through the EMR, reviewed, and updated if appropriate.  Family History:  The patient's family history is not on file.  Medical History: Past Medical History:  Diagnosis Date   Osteogenesis imperfecta    PTSD (post-traumatic stress disorder)     Surgical History: Past Surgical History:  Procedure Laterality Date   CYSTOSCOPY N/A 01/18/2024   Procedure: CYSTOSCOPY (flexible);  Surgeon: Roseann Adine PARAS., MD;  Location: [facility] ORS;  Service: Urology;  Laterality: N/A;   ELBOW ARTHROPLASTY Bilateral    HAND ARTHROPLASTY     HIP ARTHROSCOPY     MANDIBLE RECONSTRUCTION     REPAIR OF FRACTURED PENIS N/A 01/18/2024   Procedure: REPAIR, FRACTURE, PENIS;  Surgeon: Roseann Adine PARAS., MD;  Location: [facility] ORS;  Service: Urology;  Laterality: N/A;   STOMACH SURGERY     to remove ulcers.     Medications:   Current Facility-Administered Medications:    alum & mag hydroxide-simeth (MAALOX/MYLANTA) 200-200-20 MG/5ML suspension 30 mL, 30 mL, Oral, Q6H PRN, Viviann Pastor, MD   ibuprofen  (ADVIL ) tablet 600 mg, 600 mg, Oral, Q8H PRN, Viviann Pastor, MD   melatonin tablet 2.5 mg, 2.5 mg, Oral, QHS, Jessup, Charles, MD   ondansetron  (ZOFRAN ) tablet 4 mg, 4 mg, Oral, Q8H PRN, Viviann Pastor, MD  Current Outpatient Medications:    oxyCODONE  (OXY IR/ROXICODONE ) 5 MG immediate release tablet, Take 1  tablet (5 mg total) by mouth every 6 (six) hours as needed for moderate pain (pain score 4-6) or severe pain (pain score 7-10)., Disp: 20 tablet, Rfl: 0  Allergies: No Known Allergies  Eleen Litz, MD

## 2024-04-19 NOTE — ED Notes (Addendum)
 Pt provided with breakfast tray. Pt still asleep.

## 2024-04-19 NOTE — ED Notes (Signed)
 IVC PENDING  PLACEMENT  AND  CONSULT

## 2024-04-19 NOTE — ED Notes (Addendum)
 Pt is pacing around the room and is obviously agitated, however not verbally/physically threatening.Pt inquiring about when he can leave. I advised him he was under IVC and has been admitted to inpatient [facility]. I advised him he was waiting on placement in another facility. He asked when would he be transferred and I advised him I do not know at this time. Pt given emotional support. Pt expressed understanding of situation at this time, although not happy with it.

## 2024-04-19 NOTE — ED Provider Notes (Signed)
 Pt refused psychiatric assessment at this time, request to be talked to like a normal person during normal hours, not 3:50 in the morning, I am not talking to you right  now, I ain't going to be talking to you about killing people right now, I don't even want to be here  Please reschedule assessment for the morning when pt is more willing to participate and awake.  Staff informed

## 2024-04-19 NOTE — ED Notes (Signed)
 Ivc /recommend inpatient psych hospitalization after medical hospitalization cleared

## 2024-04-19 NOTE — ED Notes (Signed)
 Pt refusing to participate in psych eval due to the time of day it is.  Psych team to follow up when pt is more willing to participate.

## 2024-04-19 NOTE — ED Notes (Signed)
 Pt given lunch tray. He advised he does not want anything to eat and he wants to go home. He was informed that he is still under IVC at this time. Pt is irritated at not being able to go home, but not being verbally or physically abusive.

## 2024-04-20 LAB — URINE DRUG SCREEN, QUALITATIVE (ARMC ONLY)
Amphetamines, Ur Screen: NOT DETECTED
Barbiturates, Ur Screen: NOT DETECTED
Benzodiazepine, Ur Scrn: NOT DETECTED
Cannabinoid 50 Ng, Ur ~~LOC~~: POSITIVE — AB
Cocaine Metabolite,Ur ~~LOC~~: NOT DETECTED
MDMA (Ecstasy)Ur Screen: NOT DETECTED
Methadone Scn, Ur: NOT DETECTED
Opiate, Ur Screen: NOT DETECTED
Phencyclidine (PCP) Ur S: NOT DETECTED
Tricyclic, Ur Screen: NOT DETECTED

## 2024-04-20 MED ORDER — HYDROXYZINE HCL 25 MG PO TABS
25.0000 mg | ORAL_TABLET | Freq: Once | ORAL | Status: AC
  Administered 2024-04-20: 25 mg via ORAL
  Filled 2024-04-20: qty 1

## 2024-04-20 MED ORDER — MIRTAZAPINE 15 MG PO TBDP
15.0000 mg | ORAL_TABLET | Freq: Every day | ORAL | Status: DC
  Administered 2024-04-20 – 2024-04-23 (×4): 15 mg via ORAL
  Filled 2024-04-20 (×4): qty 1

## 2024-04-20 NOTE — ED Notes (Signed)
 Prior to administering medication, pt had taken mattress off of bed frame and had laid down on mattress in corner of room. Bed frame removed from room. Pt continued to refuse medication. Once staff came near pt, pt stood up and staff was able to hold pts arms so that this RN could medicate pt. Pt continued to curse at staff and attempt to break free of arm holds, but did not kick or spit at staff. Pt not violent after staff released pt.

## 2024-04-20 NOTE — ED Notes (Signed)
 Dinner tray placed at bedside with sprite.

## 2024-04-20 NOTE — ED Notes (Signed)
 IVC moved to bhu 5  [id number]

## 2024-04-20 NOTE — ED Notes (Signed)
 Pt requested a drink, ice water  was provided for pt.

## 2024-04-20 NOTE — ED Notes (Signed)
 Pt asking this RN to speak with him. Pt returned to room and told this RN he is having a major depressive episode, recently broke up with girlfriend, would not elaborate on why/what happened, and saying he can not stop thinking about her. Pt states is having a hard time trying to sleep, will go to sleep but dreams of her and it wakes him up with palpitations. Pt stating he came here looking for help but has been stuck in a room with nobody to talk to and nothing to do. This RN explained the process of getting admitted and that we are currently looking for a facility to send pt to to help. This RN asked pt if he would like to try something for anxiety, pt stated he would try anything to help but would rather have something to help him sleep.

## 2024-04-20 NOTE — ED Notes (Addendum)
 Writer to pts room to open door due to IVC/SI pt. Writer speaking with quad RN to provide that pt is to be moved to room 21. Pt up closing door and refusing to calm so that writer can provide need for door to remain open. Pt increasing in agitation and aggression towards staff. Security with Clinical research associate attempting to calm pt but not successful. ED provider over to area to assist as well with no succuss. Pt walked with security and staff to room 21.

## 2024-04-20 NOTE — Progress Notes (Signed)
 [facility] spoke to Johnsie/Robin at [facility] on yesterday and this AM. Patient under review by [facility].  Per request, gave # for Powell, RN for all clinical questions.SABRA Bergeron Tiran Sauseda, [facility] [phone]

## 2024-04-20 NOTE — BH Assessment (Signed)
 Writer received call from Robin at [facility]. She declined patient based upon his psychiatric acuity.

## 2024-04-20 NOTE — ED Notes (Signed)
 IVC/  PENDING  PLACEMENT

## 2024-04-20 NOTE — ED Notes (Signed)
Patient refused v-signs at this time. °

## 2024-04-20 NOTE — ED Notes (Signed)
 Michael Finley

## 2024-04-20 NOTE — ED Notes (Signed)
 Pt laying in bed with blankets covering body and face. Pt informed lunch tray placed on chair at bedside and to inform this RN of any needs.

## 2024-04-20 NOTE — ED Notes (Signed)
Pt given dinner tray and sprite.  

## 2024-04-20 NOTE — ED Notes (Signed)
Snack was provided to pt.

## 2024-04-20 NOTE — ED Notes (Addendum)
 Pt refusing to stay in room. Pt making demands from staff and sts, somebody fixen to get hurt. I aint playin with you people. EDP verbalizes medication order and more security called to assist in need for medication administration.

## 2024-04-20 NOTE — ED Notes (Signed)
 Pt asking for shower supplies to take shower. Supplies given to pt. Pt then asking for shower shoes, this RN looked over department with no shower shoes found. Pt then threw shower supplies in trash and walked back to room.

## 2024-04-21 NOTE — ED Notes (Signed)
 Pt will not answer assessment questions, will attempt again later

## 2024-04-21 NOTE — ED Notes (Signed)
 Dinner tray provided to pt

## 2024-04-21 NOTE — ED Notes (Addendum)
 This tech went to pt's room to give him his breakfast tray and get vital signs. Security officer Salim accompanied this tech. Pt was verbally aggressive towards officer. This tech asked if she could take vitals. Pt refused. When tech handed pt his tray with orange juice, pt spilled orange juice on sheets. Pt became angry and threw mattress to side of room (mattress grazed this techs back) and threw breakfast tray and pillows. This tech and officer left room. RN notified.

## 2024-04-21 NOTE — ED Notes (Signed)
Pt refused snacks.

## 2024-04-21 NOTE — ED Provider Notes (Signed)
 Emergency Medicine Observation Re-evaluation Note  Michael Finley is a 33 y.o. male, seen on rounds today.  Pt initially presented to the ED for complaints of No chief complaint on file. Currently, the patient is resting.  Physical Exam  BP 126/86   Pulse 73   Temp 98.5 F (36.9 C) (Oral)   Resp 16   Ht 5' 9 (1.753 m)   Wt 96.6 kg   SpO2 99%   BMI 31.45 kg/m  Physical Exam .Gen:  No acute distress Resp:  Breathing easily and comfortably, no accessory muscle usage Neuro:  Moving all four extremities, no gross focal neuro deficits Psych:  Resting currently, calm when awake   ED Course / MDM  EKG:   I have reviewed the labs performed to date as well as medications administered while in observation.  Recent changes in the last 24 hours include no acute events.  Plan  Current plan is for psych dispo.    Waymond Lorelle Cummins, MD 04/21/24 [phone]

## 2024-04-21 NOTE — ED Notes (Addendum)
lunch tray provided to pt.

## 2024-04-21 NOTE — ED Notes (Signed)
Ivc /pending placement 

## 2024-04-21 NOTE — ED Notes (Signed)
 Pt given a change of linens after coming to bang loudly on the nursing station door. Pt very rude to staff and keeps giving staff orders for what he wants, will tell staff don't talk to me when staff tries to communicate with him or clarify some of his requests. Pt threw his pillows and bed linens in hallway, did accept clean linens from staff with security present. Currently laying in bed and does not want staff to talk to him

## 2024-04-21 NOTE — ED Notes (Signed)
 Pt asked to speak with writer and stated he's ivc should be up because he came on Sunday and it's been 72hrs already. Pt informed that ivc has been extended because he is going for inpatient admission. Pt informed he will be ivc until a bed placement becomes available and then he will be going for inpatient treatment. Pt kept quiet and just starred at [employer]. Pt then wanted to know how long he has to stay here, he was told once the bed is available I will let him know. Informed he's currently awaiting bed placement and no time frame for when an open psychiatric bed becomes available.

## 2024-04-21 NOTE — ED Notes (Signed)
 IVC/  PENDING  PLACEMENT

## 2024-04-21 NOTE — ED Notes (Signed)
Snacks given to pt.

## 2024-04-22 NOTE — ED Notes (Signed)
 Patient provided meal tray and soda to drink

## 2024-04-22 NOTE — ED Notes (Signed)
 Pt refusing snack but given drink at this time. Pt calm and cooperative.

## 2024-04-22 NOTE — ED Provider Notes (Signed)
 Emergency Medicine Observation Re-evaluation Note  Michael Finley is a 33 y.o. male, seen on rounds today.  Pt initially presented to the ED for complaints of No chief complaint on file. Currently, the patient is upset.  He expresses frustration that he is still here after coming into [facility] voluntarily.  He states that he does not want to harm himself or anyone else.  He states he has been depressed for 20 years.  He does not understand why he still needs to be under IVC.  He states he is very frustrated in his current situation and feels like we are waiting for him to have an outburst in order to justify keeping him in the hospital.  Physical Exam  BP 128/80 (BP Location: Right Arm)   Pulse 66   Temp 98.3 F (36.8 C) (Oral)   Resp 18   Ht 5' 9 (1.753 m)   Wt 96.6 kg   SpO2 98%   BMI 31.45 kg/m  Physical Exam General: No distress Cardiac: Well-perfused appearing. Lungs: Normal respiratory effort Psych: Somewhat agitated and labile  ED Course / MDM   There have been no significant changes to the patient's status in the last 24 hours.  He awaiting inpatient psychiatric admission.  Per the last psych note from Dr. Donnelly on 8/25 he is recommended to remain under IVC and be admitted for stabilization.  I advised the patient that I would not rescind the IVC, and that the psychiatry team was concerned about his symptoms and felt that he needed to be admitted.  I expressed empathy with his situation, and advised that the last thing we wanted from him was to have an outburst; certainly we were not attempting to provoke him into escalation.  I advised that I would ask the psychiatry provider to check in on him today.  He expressed frustration, initially asked me to leave, then after starting to talk to me again, began yelling at me.  I then discontinued the evaluation prevent him from escalating further.  Plan  Current plan is for inpatient psychiatric admission.    Jacolyn Pae,  MD 04/22/24 [phone]

## 2024-04-22 NOTE — ED Notes (Signed)
Patient using phone

## 2024-04-22 NOTE — ED Notes (Signed)
 Dr. Jacolyn spoke with patient in patients room. Patient started conversation calmly but quickly escalated to yelling at the provider. Pt is very rude and verbally aggressive with staff and is not redirectable. This RN and staff security present during interaction.

## 2024-04-22 NOTE — BH Assessment (Signed)
 Referral information for Psychiatric Hospitalization faxed to;   [facility]  [phone]  [facility]  [phone]  [facility]  [phone]  [facility]  [phone]  [facility]  [phone]  [facility]  [phone]  [facility]  [phone]  [facility]  [phone]  [facility]  [phone]  [facility]  [phone]  [facility]  [phone]  [facility]  [phone]  [facility]  [phone]  [facility]  [phone]  [facility] Patient Placement  [phone]  [facility]  [phone]  [facility]  [phone]  [facility]  [phone]  [facility]  [phone]

## 2024-04-22 NOTE — ED Notes (Signed)
 Patient requested to speak with this RN, pt appears agitated and somewhat verbally aggressive. Requesting to speak with a doctor. States you are a Engineer, civil (consulting) and can't do anything for me. Patient denies SI/HI at this time.

## 2024-04-22 NOTE — ED Notes (Signed)
Patient in assigned room.

## 2024-04-22 NOTE — ED Notes (Signed)
 Assumed care of patient, pt resting in bed with eyes shut, respirations even and unlabored, no distress noted

## 2024-04-22 NOTE — ED Notes (Signed)
 Patient offered a breakfast tray but declined, requesting apple juice. This RN gave patient apple juice.

## 2024-04-22 NOTE — ED Notes (Signed)
 [facility] spoke to [place] at [facility].  The # to patient's RN was provided for placement review.   [place], [facility] [phone]

## 2024-04-22 NOTE — ED Notes (Signed)
 Patient spoke with this RN and apologized for his behavior towards me earlier. Pt verbalizes concern that he should not be here and now feels he is stuck here. This RN pulled up chair and had conversation with patient. Explained to patient that the statements he made to [facility] are what caused him to be IVC'd, whether he meant what he said or not. Patient verbalized understanding. This RN also explained POC with patient, that we are looking for facilities for placement that will get him the help he needs. Patient states he needs to be somewhere that he can get on medication and do therapy. Patient verbalizes understanding and was appreciative of our conversation.

## 2024-04-22 NOTE — ED Notes (Signed)
 Patient notified security that another patient has been pissing on the floor in the bathroom. Security notified this RN, this RN spoke with patient. Patient is angry with situation, and states that its already an issue. This RN offered to move patient to other section with a private bathroom, pt declined due to no TV in room. Pt agreed to keep this RN or other staff member in the loop if the problem escalates and stated he will just try to keep to himself in his room as much as possible. Charge RN has been made aware of situation.

## 2024-04-22 NOTE — Progress Notes (Signed)
 [facility] received call from Traci at [facility].  Traci stated her main concern is to ensure pt is appropriate and not  too aggressive for others on their unit.  Traci stated she will review patient again in the morning after their discharges.   [place], [facility] [phone]

## 2024-04-22 NOTE — ED Notes (Signed)
 Patient sleeping

## 2024-04-22 NOTE — ED Notes (Signed)
 Routine vitals deferred at this time due to patient resting. Will obtain once patient wakes up/

## 2024-04-22 NOTE — ED Notes (Signed)
 IVC Pending Pacement

## 2024-04-23 MED ORDER — CHLORPROMAZINE HCL 50 MG/2ML IJ SOLN
50.0000 mg | Freq: Three times a day (TID) | INTRAMUSCULAR | Status: DC | PRN
  Filled 2024-04-23: qty 2

## 2024-04-23 MED ORDER — PALIPERIDONE ER 3 MG PO TB24
6.0000 mg | ORAL_TABLET | Freq: Every day | ORAL | Status: DC
  Administered 2024-04-23 – 2024-04-24 (×2): 6 mg via ORAL
  Filled 2024-04-23 (×2): qty 2

## 2024-04-23 MED ORDER — BUPROPION HCL ER (XL) 150 MG PO TB24
150.0000 mg | ORAL_TABLET | Freq: Every day | ORAL | Status: DC
  Administered 2024-04-23 – 2024-04-24 (×2): 150 mg via ORAL
  Filled 2024-04-23 (×2): qty 1

## 2024-04-23 MED ORDER — PALIPERIDONE ER 3 MG PO TB24: 3.0000 mg | ORAL_TABLET | Freq: Every day | ORAL | Status: DC

## 2024-04-23 NOTE — ED Notes (Signed)
 Talked to pt and explained to him that I understand there is not much to do in the ER but sit in his room and watch tv. I explained to him that he just needs to wait for bed placement and offered support. Told pt to come and talk to staff anytime he feels he needs to talk. Pt verbalized understand of this.

## 2024-04-23 NOTE — Consult Note (Signed)
 [facility] Psychiatric Consult Follow up  Patient Name: .Michael Finley  MRN: [id number]  DOB: [birth year]  Consult Order details:  Orders (From admission, onward)     Start     Ordered   04/18/24 2205  CONSULT TO CALL ACT TEAM       Ordering Provider: Viviann Pastor, MD  Provider:  (Not yet assigned)  Question:  Reason for Consult?  Answer:  Psych consult   04/18/24 2204   04/18/24 2205  IP CONSULT TO PSYCHIATRY       Ordering Provider: Viviann Pastor, MD  Provider:  (Not yet assigned)  Question Answer Comment  Consult Timeframe URGENT - requires response within 12 hours   URGENT timeframe requires provider to provider communication, has the provider to provider communication been completed Yes   Reason for Consult? Consult for medication management   Contact phone number where the requesting provider can be reached [phone]      04/18/24 2204             Mode of Visit: In person    Psychiatry Consult Evaluation  Service Date: April 23, 2024 LOS:  LOS: 0 days  Chief Complaint SI/HI  Primary Psychiatric Diagnoses  MDD recurrent severe with out psychosis   Assessment  Zak Gondek is a 33 y.o. male admitted: Presented to the [facility]. [place], [place] speaking, Black male. Pt presented to [facility] ED under IVC. Per triage note: Patient presents with police from [facility] with IVC papers. Patient originally presented with voluntarily to [facility] with plans to buy a gun and kill himself and his girlfriend.  Patient was accompanied to the [facility] emergency room for further evaluation.  Psychiatry was consulted for safety evaluation.  04/23/24: On assessment patient is agreeable to seek treatment for his depression and mood stabilization.  He gave consent for Wellbutrin  XL 150 mg and Invega  6 mg.  Patient understands displaying safe behaviors is very much needed to be evaluated as safe for discharge.  As of today, recommend to maintain IVC and recommend inpatient psychiatric  admission.     Diagnoses:  Active Hospital problems: Active Problems:   * No active hospital problems. *    Plan   ## Psychiatric Medication Recommendations:  Wellbutrin  XL 150 mg daily, Invega  6 mg daily  ## Medical Decision Making Capacity: Not specifically addressed in this encounter  ## Further Work-up:   No additional workup required at this time   ## Disposition:-- We recommend inpatient psychiatric hospitalization after medical hospitalization. Patient has been involuntarily committed on 04/19/2024.   ## Behavioral / Environmental: -Utilize compassion and acknowledge the patient's experiences while setting clear and realistic expectations for care.    ## Safety and Observation Level:  - Based on my clinical evaluation, I estimate the patient to be at moderate risk of self harm in the current setting. - At this time, we recommend  1:1 Observation. This decision is based on my review of the chart including patient's history and current presentation, interview of the patient, mental status examination, and consideration of suicide risk including evaluating suicidal ideation, plan, intent, suicidal or self-harm behaviors, risk factors, and protective factors. This judgment is based on our ability to directly address suicide risk, implement suicide prevention strategies, and develop a safety plan while the patient is in the clinical setting. Please contact our team if there is a concern that risk level has changed.  CSSR Risk Category:C-SSRS RISK CATEGORY: No Risk  Suicide Risk Assessment: Patient has following  modifiable risk factors for suicide: active suicidal ideation, untreated depression, social isolation, and medication noncompliance, which we are addressing by inpatient psychiatric admission. Patient has following non-modifiable or demographic risk factors for suicide: male gender and psychiatric hospitalization Patient has the following protective factors against  suicide: Supportive friends  Thank you for this consult request. Recommendations have been communicated to the primary team.  We will follow-up as needed basis at this time.   Gerard Cantara, MD       History of Present Illness  Relevant Aspects of Hospital ED   On assessment patient reports that being locked up in the emergency room in a closed room is not helping with his depression or mood.  He reports that his anxiety is coming off as anger and agitation.  Patient is willing to try medications to help with his mood.  He agreed to take Wellbutrin  and Invega  for his depression and mood stabilization.  Patient is requesting to be evaluated for discharge with outpatient follow-up.  He expressed his future oriented thought process by stating he is willing to participate in outpatient services and continue the medications.  He also reminds the provider that he did go to [facility] to talk about his thoughts and depression and ended up in the ED.  He expresses understanding that maintaining safe behaviors is very much needed to be even considered for discharge.  Patient is not responding to any internal stimuli and denies SI/HI/plan.  He denies auditory/visual hallucinations.   Psychiatric and Social History  Psychiatric History:  Information collected from patient/chart  Prev Dx/Sx: Depression Current Psych Provider: Had first appointment at [facility] but had to be IVC Home Meds (current): None reported Previous Med Trials: Not willing to disclose Therapy: None reported  Prior Psych Hospitalization: Multiple, details unknown Prior Self Harm: Denies Prior Violence: Denies  Family Psych History: Unknown Family Hx suicide: Denies  Social History:   Educational Hx: Unknown Occupational Hx: Unemployed Legal Hx: Unknown Living Situation: Lives by himself Spiritual Hx: Unknown Access to weapons/lethal means: Denies  Substance History Alcohol: Denies  Tobacco: Denies Illicit drugs:  Denies Prescription drug abuse: Denies Rehab hx: Denies  Exam Findings  Physical Exam: Reviewed and agree with the physical exam findings conducted by the medical provider Vital Signs:  Temp:  [98.6 F (37 C)-98.8 F (37.1 C)] 98.6 F (37 C) (08/29 0835) Pulse Rate:  [69-70] 69 (08/29 0835) Resp:  [16-18] 16 (08/29 0835) BP: (124-159)/(87-96) 124/96 (08/29 0835) SpO2:  [99 %] 99 % (08/29 0835) Blood pressure (!) 124/96, pulse 69, temperature 98.6 F (37 C), temperature source Oral, resp. rate 16, height 5' 9 (1.753 m), weight 96.6 kg, SpO2 99%. Body mass index is 31.45 kg/m.    Mental Status Exam: General Appearance: Casual  Orientation:  Full (Time, Place, and Person)  Memory:  Immediate;   Fair Recent;   Fair Remote;   Fair  Concentration:  Concentration: Fair and Attention Span: Fair  Recall:  Fair  Attention  Poor  Eye Contact:  Minimal  Speech:  Clear and Coherent  Language:  Fair  Volume:  Normal  Mood: fine  Affect:  Tearful  Thought Process:  Coherent  Thought Content:  Logical  Suicidal Thoughts:  denies  Homicidal Thoughts:  denies  Judgement:  Impaired  Insight:  Shallow  Psychomotor Activity:  Decreased  Akathisia:  No  Fund of Knowledge:  Fair      Assets:  Manufacturing systems engineer Housing  Cognition:  WNL  ADL's:  Intact  AIMS (if indicated):        Other History   These have been pulled in through the EMR, reviewed, and updated if appropriate.  Family History:  The patient's family history is not on file.  Medical History: Past Medical History:  Diagnosis Date   Osteogenesis imperfecta    PTSD (post-traumatic stress disorder)     Surgical History: Past Surgical History:  Procedure Laterality Date   CYSTOSCOPY N/A 01/18/2024   Procedure: CYSTOSCOPY (flexible);  Surgeon: Roseann Adine PARAS., MD;  Location: [facility] ORS;  Service: Urology;  Laterality: N/A;   ELBOW ARTHROPLASTY Bilateral    HAND ARTHROPLASTY     HIP ARTHROSCOPY      MANDIBLE RECONSTRUCTION     REPAIR OF FRACTURED PENIS N/A 01/18/2024   Procedure: REPAIR, FRACTURE, PENIS;  Surgeon: Roseann Adine PARAS., MD;  Location: [facility] ORS;  Service: Urology;  Laterality: N/A;   STOMACH SURGERY     to remove ulcers.     Medications:   Current Facility-Administered Medications:    alum & mag hydroxide-simeth (MAALOX/MYLANTA) 200-200-20 MG/5ML suspension 30 mL, 30 mL, Oral, Q6H PRN, Viviann Pastor, MD   buPROPion  (WELLBUTRIN  XL) 24 hr tablet 150 mg, 150 mg, Oral, Daily, Mariaisabel Bodiford, MD   chlorproMAZINE  (THORAZINE ) injection 50 mg, 50 mg, Intramuscular, TID PRN, Delainie Chavana, MD   ibuprofen  (ADVIL ) tablet 600 mg, 600 mg, Oral, Q8H PRN, Viviann Pastor, MD   melatonin tablet 2.5 mg, 2.5 mg, Oral, QHS, Jessup, Charles, MD, 2.5 mg at 04/22/24 2104   mirtazapine  (REMERON  SOL-TAB) disintegrating tablet 15 mg, 15 mg, Oral, QHS, Kiriana Worthington, MD, 15 mg at 04/22/24 2104   ondansetron  (ZOFRAN ) tablet 4 mg, 4 mg, Oral, Q8H PRN, Viviann Pastor, MD   paliperidone  (INVEGA ) 24 hr tablet 6 mg, 6 mg, Oral, Daily, Amyla Heffner, MD No current outpatient medications on file.  Allergies: No Known Allergies  Raelle Chambers, MD

## 2024-04-23 NOTE — ED Notes (Signed)
 Snacks provided to pt

## 2024-04-23 NOTE — ED Provider Notes (Signed)
   [facility] Observation Note   ----------------------------------------- 9:29 AM on 04/23/2024 -----------------------------------------  Fuller Kivon Koplin is a 33 y.o. male currently in the emergency department.  Remains under IVC.  Awaiting psychiatric disposition. No acute events since last update.  Recent Vitals   Most recent vital signs: Vitals:   04/22/24 2200 04/23/24 0835  BP: (!) 159/87 (!) 124/96  Pulse: 70 69  Resp: 18 16  Temp: 98.8 F (37.1 C) 98.6 F (37 C)  SpO2: 99% 99%    ED Results / Procedures / Treatments   Labs (all labs ordered are listed, but only abnormal results are displayed) Labs Reviewed  COMPREHENSIVE METABOLIC PANEL WITH GFR - Abnormal; Notable for the following components:      Result Value   Potassium 3.2 (*)    Total Protein 8.2 (*)    All other components within normal limits  CBC - Abnormal; Notable for the following components:   WBC 12.3 (*)    All other components within normal limits  URINE DRUG SCREEN, QUALITATIVE ([facility] ONLY) - Abnormal; Notable for the following components:   Cannabinoid 50 Ng, Ur [place] POSITIVE (*)    All other components within normal limits  ETHANOL    MEDICATIONS ORDERED IN ED: Medications  ibuprofen  (ADVIL ) tablet 600 mg (has no administration in time range)  ondansetron  (ZOFRAN ) tablet 4 mg (has no administration in time range)  alum & mag hydroxide-simeth (MAALOX/MYLANTA) 200-200-20 MG/5ML suspension 30 mL (has no administration in time range)  melatonin tablet 2.5 mg (2.5 mg Oral Given 04/22/24 2104)  mirtazapine  (REMERON  SOL-TAB) disintegrating tablet 15 mg (15 mg Oral Given 04/22/24 2104)  LORazepam  (ATIVAN ) tablet 1 mg (1 mg Oral Given 04/19/24 1637)  ziprasidone  (GEODON ) injection 20 mg (20 mg Intramuscular Given 04/20/24 0025)  hydrOXYzine  (ATARAX ) tablet 25 mg (25 mg Oral Given 04/20/24 1212)     ED Plan   Patient remains in the emergency department under IVC awaiting  psychiatric placement.     Dorothyann Drivers, MD 04/23/24 [phone]

## 2024-04-23 NOTE — ED Notes (Signed)
 Lunch tray provided to pt.

## 2024-04-23 NOTE — ED Notes (Signed)
 Pt is frustrated, saying he's been here too long. He says he cannot keep sitting in his room and that no one will give him an update with his POC. Advised he is still recommended for inpatient admission and awaiting bed placement. He states it's taking too long and he has had it. He states he is going to do something so we can send him to jail because this place is just like jail. He refuses to hear my explanation at this time.  Offerd supportive care but pt states he has nothing against me personally but he will do something to get himself out of here.

## 2024-04-23 NOTE — ED Notes (Signed)
 Pt has been cooperative and calm this morning but has recently expressed frustration because he wants to be reevaluated.

## 2024-04-23 NOTE — ED Notes (Signed)
 ivc/pending placement.Marland Kitchen

## 2024-04-23 NOTE — ED Notes (Signed)
 Pt given breakfast.

## 2024-04-23 NOTE — ED Notes (Signed)
 IVC/  PENDING  PLACEMENT

## 2024-04-24 NOTE — ED Notes (Signed)
Pt refused shower.  

## 2024-04-24 NOTE — Discharge Instructions (Signed)
You have been seen in the Emergency Department (ED) today for a psychiatric complaint.  You have been evaluated by psychiatry and we believe you are safe to be discharged from the hospital.   ° °Please return to the ED immediately if you have ANY thoughts of hurting yourself or anyone else, so that we may help you. ° °Please avoid alcohol and drug use. ° °Follow up with your doctor and/or therapist as soon as possible regarding today's ED visit.   Please follow up any other recommendations and clinic appointments provided by the psychiatry team that saw you in the Emergency Department. ° °

## 2024-04-24 NOTE — BH Assessment (Signed)
 Per the request of psych provider, Dr. Ruther, TTS spoke with the patient's mother ([phone]) to obtain collateral information. She confirmed she has spoken with him, since he has been in the ER. She shared he is doing well and have no concerns with him discharging.

## 2024-04-24 NOTE — ED Provider Notes (Signed)
   [facility] Observation Note   ----------------------------------------- 7:39 AM on 04/24/2024 -----------------------------------------   Michael Finley is a 33 y.o. male currently in the emergency department.  Remains under IVC.  Awaiting psychiatric disposition. No acute events since last update.  Remains under IVC.  Recent Vitals   Most recent vital signs: Vitals:   04/23/24 0835 04/23/24 1619  BP: (!) 124/96 122/74  Pulse: 69 67  Resp: 16 17  Temp: 98.6 F (37 C) 98.7 F (37.1 C)  SpO2: 99% 98%   Currently sleeping Even unlabored respirations   ED Results / Procedures / Treatments    ED Plan   Patient remains in the emergency department under IVC awaiting psychiatric placement.   Dicky Anes, MD 04/24/24 [phone]

## 2024-04-24 NOTE — ED Notes (Signed)
 Pt has been compliant with medication and interventions for this RN's shift. Pt expressed concern to this RN that he is not having any helpful psychiatric treatment here besides the medication change which he reports has helped him. Pt repeatedly stated he wants to go home to his family. Pt denied HI/SI

## 2024-04-24 NOTE — ED Provider Notes (Signed)
 Patient has been seen and evaluated by psychiatry.  Dr. Ruther has given me bedside report and advises Michael Finley may be discharged.  TTS team, Kiki also advising 'I spoke with the mother. She shared she has no concerns with him discharging. She's spoken with him while in the ER. I'm going to give information to [facility] for follow up outpatient as a walk in. couldn't get a specific appointment date and time because it's the weekend. But I'm going to give him the information for their community peer support to help bridge the gap of him getting seenTHEORA Dicky Anes, MD 04/24/24 1123

## 2024-04-24 NOTE — ED Notes (Signed)
 Pt provided with breakfast tray.

## 2024-04-24 NOTE — ED Provider Notes (Signed)
 Psychiatry physician has officially rescinded the patient's involuntary commitment, and they are recommending discharge.  Patient to follow-up with his outpatient resources   Dicky Anes, MD 04/24/24 1136

## 2024-04-24 NOTE — BH Assessment (Signed)
 TTS provided the patient with the contact information and instructions on how to access outpatient services with [facility]. Also provided him with their community Peer Support. Patient agreed to have TTS give the Peer Support his contact information as well.  ____________________________________________ [facility] Address:  [address],  [place], [place] [address] Phone:  [phone]  [facility] Mitchell NOVAK. [phone]
